# Patient Record
Sex: Male | Born: 1940 | Race: White | Hispanic: No | Smoking: Former smoker
Health system: Southern US, Community
[De-identification: ages and names within clinical notes are randomized; demographics above are authoritative.]

## PROBLEM LIST (undated history)

## (undated) DIAGNOSIS — I350 Nonrheumatic aortic (valve) stenosis: Secondary | ICD-10-CM

## (undated) DIAGNOSIS — M545 Low back pain, unspecified: Secondary | ICD-10-CM

## (undated) DIAGNOSIS — J449 Chronic obstructive pulmonary disease, unspecified: Secondary | ICD-10-CM

## (undated) DIAGNOSIS — I219 Acute myocardial infarction, unspecified: Secondary | ICD-10-CM

## (undated) DIAGNOSIS — I10 Essential (primary) hypertension: Secondary | ICD-10-CM

## (undated) DIAGNOSIS — I509 Heart failure, unspecified: Secondary | ICD-10-CM

## (undated) DIAGNOSIS — G912 (Idiopathic) normal pressure hydrocephalus: Secondary | ICD-10-CM

## (undated) DIAGNOSIS — E785 Hyperlipidemia, unspecified: Secondary | ICD-10-CM

## (undated) DIAGNOSIS — I639 Cerebral infarction, unspecified: Secondary | ICD-10-CM

## (undated) DIAGNOSIS — F0390 Unspecified dementia without behavioral disturbance: Secondary | ICD-10-CM

## (undated) DIAGNOSIS — K219 Gastro-esophageal reflux disease without esophagitis: Secondary | ICD-10-CM

## (undated) DIAGNOSIS — N200 Calculus of kidney: Secondary | ICD-10-CM

## (undated) DIAGNOSIS — D649 Anemia, unspecified: Secondary | ICD-10-CM

## (undated) DIAGNOSIS — I2510 Atherosclerotic heart disease of native coronary artery without angina pectoris: Secondary | ICD-10-CM

## (undated) HISTORY — DX: Atherosclerotic heart disease of native coronary artery without angina pectoris: I25.10

## (undated) HISTORY — DX: Chronic obstructive pulmonary disease, unspecified: J44.9

## (undated) HISTORY — PX: SPINE SURGERY: SHX786

## (undated) HISTORY — DX: Acute myocardial infarction, unspecified: I21.9

## (undated) HISTORY — DX: Nonrheumatic aortic (valve) stenosis: I35.0

## (undated) HISTORY — DX: Heart failure, unspecified: I50.9

## (undated) HISTORY — DX: Low back pain, unspecified: M54.50

## (undated) HISTORY — DX: Anemia, unspecified: D64.9

## (undated) HISTORY — DX: Unspecified dementia, unspecified severity, without behavioral disturbance, psychotic disturbance, mood disturbance, and anxiety: F03.90

## (undated) HISTORY — DX: Essential (primary) hypertension: I10

## (undated) HISTORY — PX: MICRODISCECTOMY LUMBAR: SUR864

## (undated) HISTORY — DX: (Idiopathic) normal pressure hydrocephalus: G91.2

## (undated) HISTORY — DX: Low back pain: M54.5

## (undated) HISTORY — DX: Hyperlipidemia, unspecified: E78.5

## (undated) HISTORY — DX: Calculus of kidney: N20.0

## (undated) HISTORY — DX: Gastro-esophageal reflux disease without esophagitis: K21.9

## (undated) HISTORY — DX: Cerebral infarction, unspecified: I63.9

## (undated) HISTORY — PX: CARDIAC CATHETERIZATION: SHX172

---

## 2001-07-01 DIAGNOSIS — I639 Cerebral infarction, unspecified: Secondary | ICD-10-CM

## 2001-07-01 HISTORY — DX: Cerebral infarction, unspecified: I63.9

## 2001-10-31 HISTORY — PX: CORONARY ARTERY BYPASS GRAFT: SHX141

## 2001-11-09 ENCOUNTER — Inpatient Hospital Stay (HOSPITAL_COMMUNITY)
Admission: AD | Admit: 2001-11-09 | Discharge: 2001-11-17 | Payer: Self-pay | Admitting: Thoracic Surgery (Cardiothoracic Vascular Surgery)

## 2001-11-10 ENCOUNTER — Encounter: Payer: Self-pay | Admitting: Thoracic Surgery (Cardiothoracic Vascular Surgery)

## 2001-11-12 ENCOUNTER — Encounter: Payer: Self-pay | Admitting: Thoracic Surgery (Cardiothoracic Vascular Surgery)

## 2001-11-13 ENCOUNTER — Encounter: Payer: Self-pay | Admitting: Thoracic Surgery (Cardiothoracic Vascular Surgery)

## 2001-11-14 ENCOUNTER — Encounter: Payer: Self-pay | Admitting: Thoracic Surgery (Cardiothoracic Vascular Surgery)

## 2002-10-15 ENCOUNTER — Encounter: Admission: RE | Admit: 2002-10-15 | Discharge: 2002-10-15 | Payer: Self-pay | Admitting: Neurosurgery

## 2002-10-15 ENCOUNTER — Encounter: Payer: Self-pay | Admitting: Neurosurgery

## 2002-10-29 ENCOUNTER — Encounter: Payer: Self-pay | Admitting: Neurosurgery

## 2002-10-29 ENCOUNTER — Encounter: Admission: RE | Admit: 2002-10-29 | Discharge: 2002-10-29 | Payer: Self-pay | Admitting: Neurosurgery

## 2002-11-12 ENCOUNTER — Encounter: Admission: RE | Admit: 2002-11-12 | Discharge: 2002-11-12 | Payer: Self-pay | Admitting: Neurosurgery

## 2002-11-12 ENCOUNTER — Encounter: Payer: Self-pay | Admitting: Neurosurgery

## 2003-01-09 ENCOUNTER — Encounter: Payer: Self-pay | Admitting: Neurosurgery

## 2003-01-09 ENCOUNTER — Encounter: Admission: RE | Admit: 2003-01-09 | Discharge: 2003-01-09 | Payer: Self-pay | Admitting: Neurosurgery

## 2003-07-01 ENCOUNTER — Encounter: Payer: Self-pay | Admitting: Neurosurgery

## 2003-07-02 ENCOUNTER — Encounter: Payer: Self-pay | Admitting: Neurosurgery

## 2003-07-02 ENCOUNTER — Ambulatory Visit (HOSPITAL_COMMUNITY): Admission: RE | Admit: 2003-07-02 | Discharge: 2003-07-02 | Payer: Self-pay | Admitting: Neurosurgery

## 2004-01-09 ENCOUNTER — Encounter: Admission: RE | Admit: 2004-01-09 | Discharge: 2004-01-09 | Payer: Self-pay | Admitting: Family Medicine

## 2004-01-30 ENCOUNTER — Encounter: Admission: RE | Admit: 2004-01-30 | Discharge: 2004-01-30 | Payer: Self-pay | Admitting: Family Medicine

## 2004-02-04 ENCOUNTER — Encounter: Admission: RE | Admit: 2004-02-04 | Discharge: 2004-02-04 | Payer: Self-pay | Admitting: Family Medicine

## 2004-03-03 ENCOUNTER — Encounter: Admission: RE | Admit: 2004-03-03 | Discharge: 2004-03-03 | Payer: Self-pay | Admitting: Family Medicine

## 2004-03-17 ENCOUNTER — Encounter: Admission: RE | Admit: 2004-03-17 | Discharge: 2004-03-17 | Payer: Self-pay | Admitting: Sports Medicine

## 2004-04-15 ENCOUNTER — Encounter: Admission: RE | Admit: 2004-04-15 | Discharge: 2004-04-15 | Payer: Self-pay | Admitting: Family Medicine

## 2004-06-18 ENCOUNTER — Encounter: Admission: RE | Admit: 2004-06-18 | Discharge: 2004-06-18 | Payer: Self-pay | Admitting: Family Medicine

## 2004-07-07 ENCOUNTER — Ambulatory Visit: Payer: Self-pay | Admitting: Family Medicine

## 2004-10-29 ENCOUNTER — Ambulatory Visit: Payer: Self-pay | Admitting: Family Medicine

## 2005-01-07 ENCOUNTER — Emergency Department (HOSPITAL_COMMUNITY): Admission: EM | Admit: 2005-01-07 | Discharge: 2005-01-07 | Payer: Self-pay | Admitting: Emergency Medicine

## 2005-04-01 ENCOUNTER — Ambulatory Visit: Payer: Self-pay | Admitting: Family Medicine

## 2005-05-13 ENCOUNTER — Ambulatory Visit: Payer: Self-pay | Admitting: Physician Assistant

## 2005-06-27 ENCOUNTER — Ambulatory Visit (HOSPITAL_COMMUNITY): Admission: RE | Admit: 2005-06-27 | Discharge: 2005-06-27 | Payer: Self-pay | Admitting: Family Medicine

## 2005-06-27 ENCOUNTER — Encounter: Admission: RE | Admit: 2005-06-27 | Discharge: 2005-06-27 | Payer: Self-pay | Admitting: Family Medicine

## 2005-06-27 ENCOUNTER — Ambulatory Visit: Payer: Self-pay | Admitting: Family Medicine

## 2005-07-01 ENCOUNTER — Ambulatory Visit: Payer: Self-pay | Admitting: Family Medicine

## 2005-07-13 ENCOUNTER — Encounter: Admission: RE | Admit: 2005-07-13 | Discharge: 2005-07-13 | Payer: Self-pay | Admitting: Sports Medicine

## 2005-07-14 ENCOUNTER — Ambulatory Visit: Payer: Self-pay | Admitting: Family Medicine

## 2005-07-28 ENCOUNTER — Ambulatory Visit (HOSPITAL_COMMUNITY): Admission: RE | Admit: 2005-07-28 | Discharge: 2005-07-28 | Payer: Self-pay | Admitting: Gastroenterology

## 2005-07-28 ENCOUNTER — Encounter (INDEPENDENT_AMBULATORY_CARE_PROVIDER_SITE_OTHER): Payer: Self-pay | Admitting: Specialist

## 2005-07-29 ENCOUNTER — Ambulatory Visit: Payer: Self-pay | Admitting: Family Medicine

## 2005-11-18 ENCOUNTER — Other Ambulatory Visit: Payer: Self-pay

## 2005-11-22 ENCOUNTER — Inpatient Hospital Stay: Payer: Self-pay | Admitting: Unknown Physician Specialty

## 2006-01-09 ENCOUNTER — Ambulatory Visit: Payer: Self-pay | Admitting: Family Medicine

## 2006-01-27 ENCOUNTER — Ambulatory Visit: Payer: Self-pay | Admitting: Family Medicine

## 2006-07-10 ENCOUNTER — Ambulatory Visit: Payer: Self-pay | Admitting: Family Medicine

## 2006-08-04 ENCOUNTER — Ambulatory Visit: Payer: Self-pay | Admitting: Family Medicine

## 2006-10-13 ENCOUNTER — Ambulatory Visit: Payer: Self-pay | Admitting: Sports Medicine

## 2006-10-26 ENCOUNTER — Ambulatory Visit: Payer: Self-pay | Admitting: Family Medicine

## 2006-12-28 DIAGNOSIS — K219 Gastro-esophageal reflux disease without esophagitis: Secondary | ICD-10-CM

## 2006-12-28 DIAGNOSIS — E119 Type 2 diabetes mellitus without complications: Secondary | ICD-10-CM

## 2006-12-28 DIAGNOSIS — D509 Iron deficiency anemia, unspecified: Secondary | ICD-10-CM | POA: Insufficient documentation

## 2006-12-28 DIAGNOSIS — F411 Generalized anxiety disorder: Secondary | ICD-10-CM | POA: Insufficient documentation

## 2006-12-28 DIAGNOSIS — E785 Hyperlipidemia, unspecified: Secondary | ICD-10-CM | POA: Insufficient documentation

## 2007-01-25 ENCOUNTER — Encounter: Payer: Self-pay | Admitting: Family Medicine

## 2007-01-25 ENCOUNTER — Ambulatory Visit: Payer: Self-pay | Admitting: Family Medicine

## 2007-01-25 DIAGNOSIS — I2510 Atherosclerotic heart disease of native coronary artery without angina pectoris: Secondary | ICD-10-CM | POA: Insufficient documentation

## 2007-01-25 DIAGNOSIS — Z8679 Personal history of other diseases of the circulatory system: Secondary | ICD-10-CM | POA: Insufficient documentation

## 2007-01-25 LAB — CONVERTED CEMR LAB
Calcium: 9.7 mg/dL (ref 8.4–10.5)
Direct LDL: 123 mg/dL — ABNORMAL HIGH
Hgb A1c MFr Bld: 7.9 %
Potassium: 4.4 meq/L (ref 3.5–5.3)
Sodium: 141 meq/L (ref 135–145)

## 2007-01-26 ENCOUNTER — Encounter (INDEPENDENT_AMBULATORY_CARE_PROVIDER_SITE_OTHER): Payer: Self-pay | Admitting: *Deleted

## 2007-02-15 ENCOUNTER — Encounter: Payer: Self-pay | Admitting: Family Medicine

## 2007-03-07 ENCOUNTER — Telehealth: Payer: Self-pay | Admitting: *Deleted

## 2007-05-07 ENCOUNTER — Telehealth (INDEPENDENT_AMBULATORY_CARE_PROVIDER_SITE_OTHER): Payer: Self-pay | Admitting: Family Medicine

## 2007-05-09 ENCOUNTER — Telehealth: Payer: Self-pay | Admitting: *Deleted

## 2007-05-10 ENCOUNTER — Encounter (INDEPENDENT_AMBULATORY_CARE_PROVIDER_SITE_OTHER): Payer: Self-pay | Admitting: Family Medicine

## 2007-05-10 ENCOUNTER — Ambulatory Visit: Payer: Self-pay | Admitting: Family Medicine

## 2007-05-17 ENCOUNTER — Telehealth (INDEPENDENT_AMBULATORY_CARE_PROVIDER_SITE_OTHER): Payer: Self-pay | Admitting: Family Medicine

## 2007-05-21 ENCOUNTER — Telehealth: Payer: Self-pay | Admitting: *Deleted

## 2007-05-23 ENCOUNTER — Encounter: Payer: Self-pay | Admitting: *Deleted

## 2007-06-07 ENCOUNTER — Ambulatory Visit: Payer: Self-pay | Admitting: Family Medicine

## 2007-06-07 ENCOUNTER — Encounter (INDEPENDENT_AMBULATORY_CARE_PROVIDER_SITE_OTHER): Payer: Self-pay | Admitting: Family Medicine

## 2007-06-07 LAB — CONVERTED CEMR LAB
Chloride: 108 meq/L (ref 96–112)
Hgb A1c MFr Bld: 7.3 %
Potassium: 4.6 meq/L (ref 3.5–5.3)
Sodium: 143 meq/L (ref 135–145)

## 2007-06-15 ENCOUNTER — Telehealth: Payer: Self-pay | Admitting: *Deleted

## 2007-07-06 ENCOUNTER — Telehealth: Payer: Self-pay | Admitting: *Deleted

## 2007-07-16 ENCOUNTER — Telehealth (INDEPENDENT_AMBULATORY_CARE_PROVIDER_SITE_OTHER): Payer: Self-pay | Admitting: Family Medicine

## 2007-07-24 ENCOUNTER — Telehealth (INDEPENDENT_AMBULATORY_CARE_PROVIDER_SITE_OTHER): Payer: Self-pay | Admitting: Family Medicine

## 2007-08-03 ENCOUNTER — Encounter: Payer: Self-pay | Admitting: *Deleted

## 2007-08-03 ENCOUNTER — Telehealth: Payer: Self-pay | Admitting: *Deleted

## 2007-10-05 ENCOUNTER — Telehealth (INDEPENDENT_AMBULATORY_CARE_PROVIDER_SITE_OTHER): Payer: Self-pay | Admitting: Family Medicine

## 2007-11-15 ENCOUNTER — Emergency Department (HOSPITAL_COMMUNITY): Admission: EM | Admit: 2007-11-15 | Discharge: 2007-11-15 | Payer: Self-pay | Admitting: Emergency Medicine

## 2007-11-16 ENCOUNTER — Telehealth (INDEPENDENT_AMBULATORY_CARE_PROVIDER_SITE_OTHER): Payer: Self-pay | Admitting: Family Medicine

## 2007-11-16 ENCOUNTER — Ambulatory Visit: Payer: Self-pay | Admitting: Family Medicine

## 2007-11-16 LAB — CONVERTED CEMR LAB: Hgb A1c MFr Bld: 7.6 %

## 2007-11-20 ENCOUNTER — Telehealth: Payer: Self-pay | Admitting: Family Medicine

## 2007-11-29 ENCOUNTER — Encounter (INDEPENDENT_AMBULATORY_CARE_PROVIDER_SITE_OTHER): Payer: Self-pay | Admitting: *Deleted

## 2007-11-30 ENCOUNTER — Telehealth (INDEPENDENT_AMBULATORY_CARE_PROVIDER_SITE_OTHER): Payer: Self-pay | Admitting: Family Medicine

## 2007-11-30 ENCOUNTER — Encounter (INDEPENDENT_AMBULATORY_CARE_PROVIDER_SITE_OTHER): Payer: Self-pay | Admitting: *Deleted

## 2007-12-11 ENCOUNTER — Telehealth (INDEPENDENT_AMBULATORY_CARE_PROVIDER_SITE_OTHER): Payer: Self-pay | Admitting: Family Medicine

## 2007-12-18 ENCOUNTER — Ambulatory Visit: Payer: Self-pay | Admitting: Family Medicine

## 2007-12-18 DIAGNOSIS — R32 Unspecified urinary incontinence: Secondary | ICD-10-CM

## 2008-02-11 ENCOUNTER — Encounter (INDEPENDENT_AMBULATORY_CARE_PROVIDER_SITE_OTHER): Payer: Self-pay | Admitting: Family Medicine

## 2008-04-18 ENCOUNTER — Encounter (INDEPENDENT_AMBULATORY_CARE_PROVIDER_SITE_OTHER): Payer: Self-pay | Admitting: Family Medicine

## 2008-05-07 ENCOUNTER — Ambulatory Visit: Payer: Self-pay | Admitting: Family Medicine

## 2008-05-07 LAB — CONVERTED CEMR LAB: Hgb A1c MFr Bld: 8.6 %

## 2008-07-14 ENCOUNTER — Telehealth: Payer: Self-pay | Admitting: *Deleted

## 2008-07-20 ENCOUNTER — Telehealth: Payer: Self-pay | Admitting: Family Medicine

## 2008-07-21 ENCOUNTER — Telehealth: Payer: Self-pay | Admitting: *Deleted

## 2008-07-21 ENCOUNTER — Telehealth (INDEPENDENT_AMBULATORY_CARE_PROVIDER_SITE_OTHER): Payer: Self-pay | Admitting: Family Medicine

## 2008-07-21 ENCOUNTER — Encounter (INDEPENDENT_AMBULATORY_CARE_PROVIDER_SITE_OTHER): Payer: Self-pay | Admitting: Family Medicine

## 2008-07-22 ENCOUNTER — Telehealth (INDEPENDENT_AMBULATORY_CARE_PROVIDER_SITE_OTHER): Payer: Self-pay | Admitting: *Deleted

## 2008-07-22 ENCOUNTER — Telehealth (INDEPENDENT_AMBULATORY_CARE_PROVIDER_SITE_OTHER): Payer: Self-pay | Admitting: Family Medicine

## 2008-07-30 ENCOUNTER — Telehealth: Payer: Self-pay | Admitting: *Deleted

## 2008-08-05 ENCOUNTER — Telehealth: Payer: Self-pay | Admitting: *Deleted

## 2008-08-06 ENCOUNTER — Telehealth (INDEPENDENT_AMBULATORY_CARE_PROVIDER_SITE_OTHER): Payer: Self-pay | Admitting: *Deleted

## 2008-08-07 ENCOUNTER — Telehealth: Payer: Self-pay | Admitting: *Deleted

## 2008-08-12 ENCOUNTER — Ambulatory Visit: Payer: Self-pay | Admitting: Family Medicine

## 2008-08-12 ENCOUNTER — Encounter (INDEPENDENT_AMBULATORY_CARE_PROVIDER_SITE_OTHER): Payer: Self-pay | Admitting: Family Medicine

## 2008-08-12 LAB — CONVERTED CEMR LAB: Hgb A1c MFr Bld: 7.7 %

## 2008-08-13 ENCOUNTER — Telehealth (INDEPENDENT_AMBULATORY_CARE_PROVIDER_SITE_OTHER): Payer: Self-pay | Admitting: *Deleted

## 2008-08-20 ENCOUNTER — Telehealth (INDEPENDENT_AMBULATORY_CARE_PROVIDER_SITE_OTHER): Payer: Self-pay | Admitting: *Deleted

## 2008-09-08 ENCOUNTER — Telehealth (INDEPENDENT_AMBULATORY_CARE_PROVIDER_SITE_OTHER): Payer: Self-pay | Admitting: Family Medicine

## 2008-09-09 ENCOUNTER — Encounter (INDEPENDENT_AMBULATORY_CARE_PROVIDER_SITE_OTHER): Payer: Self-pay | Admitting: *Deleted

## 2008-09-09 ENCOUNTER — Telehealth (INDEPENDENT_AMBULATORY_CARE_PROVIDER_SITE_OTHER): Payer: Self-pay | Admitting: Family Medicine

## 2008-09-12 ENCOUNTER — Telehealth (INDEPENDENT_AMBULATORY_CARE_PROVIDER_SITE_OTHER): Payer: Self-pay | Admitting: Family Medicine

## 2008-09-16 ENCOUNTER — Telehealth: Payer: Self-pay | Admitting: *Deleted

## 2008-09-17 ENCOUNTER — Ambulatory Visit: Payer: Self-pay | Admitting: Family Medicine

## 2008-09-17 ENCOUNTER — Encounter (INDEPENDENT_AMBULATORY_CARE_PROVIDER_SITE_OTHER): Payer: Self-pay | Admitting: Family Medicine

## 2008-09-18 ENCOUNTER — Encounter (INDEPENDENT_AMBULATORY_CARE_PROVIDER_SITE_OTHER): Payer: Self-pay | Admitting: Family Medicine

## 2008-09-18 ENCOUNTER — Telehealth (INDEPENDENT_AMBULATORY_CARE_PROVIDER_SITE_OTHER): Payer: Self-pay | Admitting: Family Medicine

## 2008-09-18 LAB — CONVERTED CEMR LAB
Cocaine Metabolites: NEGATIVE
Creatinine,U: 72.3 mg/dL
Ethyl Alcohol: <10 mg/dL (ref <10)
Opiates: NEGATIVE
Phencyclidine (PCP): NEGATIVE
Propoxyphene: NEGATIVE

## 2008-09-19 ENCOUNTER — Telehealth: Payer: Self-pay | Admitting: *Deleted

## 2008-09-22 ENCOUNTER — Encounter (INDEPENDENT_AMBULATORY_CARE_PROVIDER_SITE_OTHER): Payer: Self-pay | Admitting: Family Medicine

## 2008-09-23 ENCOUNTER — Ambulatory Visit: Payer: Self-pay | Admitting: Family Medicine

## 2008-09-24 ENCOUNTER — Telehealth (INDEPENDENT_AMBULATORY_CARE_PROVIDER_SITE_OTHER): Payer: Self-pay | Admitting: Family Medicine

## 2008-12-01 ENCOUNTER — Ambulatory Visit: Payer: Self-pay | Admitting: Family Medicine

## 2008-12-01 DIAGNOSIS — Z87442 Personal history of urinary calculi: Secondary | ICD-10-CM

## 2008-12-09 LAB — CONVERTED CEMR LAB
ALT: 14 units/L (ref 0–53)
AST: 19 units/L (ref 0–37)
Alkaline Phosphatase: 99 units/L (ref 39–117)
Basophils Absolute: 0.1 10*3/uL (ref 0.0–0.1)
Basophils Relative: 0.9 % (ref 0.0–3.0)
Bilirubin, Direct: 0.2 mg/dL (ref 0.0–0.3)
CO2: 31 meq/L (ref 19–32)
Calcium: 9.5 mg/dL (ref 8.4–10.5)
Chloride: 107 meq/L (ref 96–112)
Glucose, Bld: 215 mg/dL — ABNORMAL HIGH (ref 70–99)
Hemoglobin: 14.1 g/dL (ref 13.0–17.0)
Lymphocytes Relative: 31.3 % (ref 12.0–46.0)
MCHC: 34.6 g/dL (ref 30.0–36.0)
Monocytes Relative: 3.7 % (ref 3.0–12.0)
Neutro Abs: 4.3 10*3/uL (ref 1.4–7.7)
Neutrophils Relative %: 60.1 % (ref 43.0–77.0)
PSA: 0.19 ng/mL (ref 0.10–4.00)
RBC: 4.42 M/uL (ref 4.22–5.81)
RDW: 12.8 % (ref 11.5–14.6)
Sodium: 141 meq/L (ref 135–145)
TSH: 0.72 microintl units/mL (ref 0.35–5.50)
Total Bilirubin: 0.7 mg/dL (ref 0.3–1.2)
Total Protein: 6.7 g/dL (ref 6.0–8.3)

## 2009-01-21 ENCOUNTER — Encounter: Payer: Self-pay | Admitting: Family Medicine

## 2009-01-22 ENCOUNTER — Telehealth: Payer: Self-pay | Admitting: Family Medicine

## 2009-03-25 ENCOUNTER — Ambulatory Visit: Payer: Self-pay | Admitting: Family Medicine

## 2009-03-26 LAB — CONVERTED CEMR LAB
CO2: 31 meq/L (ref 19–32)
Chloride: 109 meq/L (ref 96–112)
Creatinine, Ser: 0.9 mg/dL (ref 0.4–1.5)
Hgb A1c MFr Bld: 7.6 % — ABNORMAL HIGH (ref 4.6–6.5)
Potassium: 4.7 meq/L (ref 3.5–5.1)
Sodium: 143 meq/L (ref 135–145)

## 2009-03-27 ENCOUNTER — Ambulatory Visit: Payer: Self-pay | Admitting: Cardiology

## 2009-04-07 ENCOUNTER — Encounter: Payer: Self-pay | Admitting: Cardiology

## 2009-04-07 ENCOUNTER — Ambulatory Visit: Payer: Self-pay

## 2009-04-13 ENCOUNTER — Encounter: Payer: Self-pay | Admitting: Cardiology

## 2009-04-13 ENCOUNTER — Ambulatory Visit: Payer: Self-pay | Admitting: Internal Medicine

## 2009-04-14 ENCOUNTER — Encounter: Payer: Self-pay | Admitting: Cardiology

## 2009-04-14 ENCOUNTER — Ambulatory Visit: Payer: Self-pay | Admitting: Cardiology

## 2009-04-14 LAB — CONVERTED CEMR LAB
ALT: 13 U/L
AST: 16 U/L
Albumin: 3.9 g/dL
Alkaline Phosphatase: 92 U/L
BUN: 12 mg/dL
CO2: 25 meq/L
Calcium: 9.3 mg/dL
Chloride: 111 meq/L
Cholesterol: 145 mg/dL
Creatinine, Ser: 0.95 mg/dL
Glucose, Bld: 164 mg/dL — ABNORMAL HIGH
HDL: 42 mg/dL
LDL Cholesterol: 83 mg/dL
Potassium: 3.9 meq/L
Sodium: 148 meq/L — ABNORMAL HIGH
Total Bilirubin: 0.4 mg/dL
Total CHOL/HDL Ratio: 3.5
Total Protein: 6.6 g/dL
Triglycerides: 101 mg/dL
VLDL: 20 mg/dL

## 2009-04-15 ENCOUNTER — Ambulatory Visit: Payer: Self-pay | Admitting: Family Medicine

## 2009-04-15 DIAGNOSIS — E1149 Type 2 diabetes mellitus with other diabetic neurological complication: Secondary | ICD-10-CM | POA: Insufficient documentation

## 2009-04-16 ENCOUNTER — Telehealth (INDEPENDENT_AMBULATORY_CARE_PROVIDER_SITE_OTHER): Payer: Self-pay | Admitting: *Deleted

## 2009-04-20 ENCOUNTER — Encounter: Payer: Self-pay | Admitting: Cardiology

## 2009-04-20 ENCOUNTER — Ambulatory Visit: Payer: Self-pay

## 2009-04-30 ENCOUNTER — Telehealth: Payer: Self-pay | Admitting: Family Medicine

## 2009-05-01 ENCOUNTER — Telehealth: Payer: Self-pay | Admitting: Family Medicine

## 2009-05-05 ENCOUNTER — Encounter: Payer: Self-pay | Admitting: Family Medicine

## 2009-05-07 ENCOUNTER — Ambulatory Visit: Payer: Self-pay | Admitting: Family Medicine

## 2009-05-07 LAB — CONVERTED CEMR LAB
HDL goal, serum: 40 mg/dL
LDL Goal: 70 mg/dL

## 2009-05-08 ENCOUNTER — Telehealth: Payer: Self-pay | Admitting: Family Medicine

## 2009-05-14 ENCOUNTER — Encounter: Payer: Self-pay | Admitting: Family Medicine

## 2009-05-14 ENCOUNTER — Telehealth: Payer: Self-pay | Admitting: Family Medicine

## 2009-05-15 ENCOUNTER — Ambulatory Visit: Payer: Self-pay | Admitting: Cardiovascular Disease

## 2009-05-15 ENCOUNTER — Inpatient Hospital Stay (HOSPITAL_COMMUNITY): Admission: EM | Admit: 2009-05-15 | Discharge: 2009-05-26 | Payer: Self-pay | Admitting: Emergency Medicine

## 2009-05-15 ENCOUNTER — Ambulatory Visit: Payer: Self-pay | Admitting: Internal Medicine

## 2009-05-15 ENCOUNTER — Encounter (INDEPENDENT_AMBULATORY_CARE_PROVIDER_SITE_OTHER): Payer: Self-pay | Admitting: Internal Medicine

## 2009-05-15 ENCOUNTER — Encounter: Payer: Self-pay | Admitting: Family Medicine

## 2009-05-15 ENCOUNTER — Telehealth: Payer: Self-pay | Admitting: Family Medicine

## 2009-05-16 ENCOUNTER — Encounter (INDEPENDENT_AMBULATORY_CARE_PROVIDER_SITE_OTHER): Payer: Self-pay | Admitting: Neurology

## 2009-05-18 ENCOUNTER — Encounter (INDEPENDENT_AMBULATORY_CARE_PROVIDER_SITE_OTHER): Payer: Self-pay | Admitting: Internal Medicine

## 2009-06-04 ENCOUNTER — Ambulatory Visit: Payer: Self-pay | Admitting: Family Medicine

## 2009-06-09 ENCOUNTER — Telehealth: Payer: Self-pay | Admitting: Family Medicine

## 2009-06-19 ENCOUNTER — Encounter: Payer: Self-pay | Admitting: Family Medicine

## 2009-06-29 ENCOUNTER — Telehealth: Payer: Self-pay | Admitting: Family Medicine

## 2009-07-01 ENCOUNTER — Telehealth: Payer: Self-pay | Admitting: Family Medicine

## 2009-07-07 ENCOUNTER — Encounter: Payer: Self-pay | Admitting: Family Medicine

## 2009-07-15 ENCOUNTER — Encounter: Payer: Self-pay | Admitting: Cardiology

## 2009-07-15 ENCOUNTER — Ambulatory Visit: Payer: Self-pay | Admitting: Cardiovascular Disease

## 2009-07-16 ENCOUNTER — Telehealth: Payer: Self-pay | Admitting: Family Medicine

## 2009-07-16 ENCOUNTER — Ambulatory Visit: Payer: Self-pay | Admitting: Cardiology

## 2009-07-16 DIAGNOSIS — I509 Heart failure, unspecified: Secondary | ICD-10-CM | POA: Insufficient documentation

## 2009-07-16 LAB — CONVERTED CEMR LAB
ALT: 12 units/L (ref 0–53)
AST: 12 units/L (ref 0–37)
Albumin: 4.1 g/dL (ref 3.5–5.2)
Alkaline Phosphatase: 117 units/L (ref 39–117)
Bilirubin, Direct: 0.1 mg/dL (ref 0.0–0.3)
Cholesterol: 115 mg/dL (ref 0–200)
HDL: 34 mg/dL — ABNORMAL LOW (ref >39)
Total Bilirubin: 0.6 mg/dL (ref 0.3–1.2)

## 2009-07-30 ENCOUNTER — Ambulatory Visit: Payer: Self-pay | Admitting: Cardiology

## 2009-08-03 LAB — CONVERTED CEMR LAB
CO2: 23 meq/L (ref 19–32)
Calcium: 9.1 mg/dL (ref 8.4–10.5)
Creatinine, Ser: 0.92 mg/dL (ref 0.40–1.50)
Glucose, Bld: 178 mg/dL — ABNORMAL HIGH (ref 70–99)

## 2009-08-19 ENCOUNTER — Ambulatory Visit: Payer: Self-pay | Admitting: Family Medicine

## 2009-09-16 ENCOUNTER — Encounter (INDEPENDENT_AMBULATORY_CARE_PROVIDER_SITE_OTHER): Payer: Self-pay | Admitting: *Deleted

## 2009-09-21 ENCOUNTER — Ambulatory Visit: Payer: Self-pay | Admitting: Family Medicine

## 2009-09-28 ENCOUNTER — Encounter: Payer: Self-pay | Admitting: Family Medicine

## 2009-09-30 ENCOUNTER — Encounter: Payer: Self-pay | Admitting: Family Medicine

## 2009-10-05 ENCOUNTER — Ambulatory Visit: Payer: Self-pay | Admitting: Family Medicine

## 2009-12-02 ENCOUNTER — Telehealth: Payer: Self-pay | Admitting: Family Medicine

## 2009-12-05 ENCOUNTER — Encounter: Payer: Self-pay | Admitting: Family Medicine

## 2009-12-05 ENCOUNTER — Emergency Department: Payer: Self-pay | Admitting: Emergency Medicine

## 2009-12-08 ENCOUNTER — Telehealth: Payer: Self-pay | Admitting: Family Medicine

## 2009-12-14 ENCOUNTER — Encounter: Payer: Self-pay | Admitting: Family Medicine

## 2009-12-14 ENCOUNTER — Ambulatory Visit: Payer: Self-pay | Admitting: Cardiovascular Disease

## 2009-12-14 DIAGNOSIS — I10 Essential (primary) hypertension: Secondary | ICD-10-CM

## 2009-12-17 ENCOUNTER — Ambulatory Visit: Payer: Self-pay | Admitting: Family Medicine

## 2009-12-18 ENCOUNTER — Encounter (INDEPENDENT_AMBULATORY_CARE_PROVIDER_SITE_OTHER): Payer: Self-pay | Admitting: *Deleted

## 2009-12-18 LAB — CONVERTED CEMR LAB
Albumin: 4 g/dL (ref 3.5–5.2)
Basophils Relative: 0.2 % (ref 0.0–3.0)
Bilirubin, Direct: 0.2 mg/dL (ref 0.0–0.3)
Calcium: 9.4 mg/dL (ref 8.4–10.5)
Creatinine, Ser: 0.8 mg/dL (ref 0.4–1.5)
Eosinophils Absolute: 0.2 10*3/uL (ref 0.0–0.7)
Glucose, Bld: 335 mg/dL — ABNORMAL HIGH (ref 70–99)
MCHC: 32.5 g/dL (ref 30.0–36.0)
MCV: 95.5 fL (ref 78.0–100.0)
Monocytes Absolute: 0.4 10*3/uL (ref 0.1–1.0)
Neutrophils Relative %: 64.1 % (ref 43.0–77.0)
Platelets: 130 10*3/uL — ABNORMAL LOW (ref 150.0–400.0)
RDW: 12.2 % (ref 11.5–14.6)
Total Bilirubin: 0.7 mg/dL (ref 0.3–1.2)
Total Protein: 6.7 g/dL (ref 6.0–8.3)

## 2010-01-04 ENCOUNTER — Ambulatory Visit: Payer: Self-pay | Admitting: Family Medicine

## 2010-01-05 ENCOUNTER — Telehealth: Payer: Self-pay | Admitting: Family Medicine

## 2010-01-07 ENCOUNTER — Telehealth: Payer: Self-pay | Admitting: Family Medicine

## 2010-01-15 ENCOUNTER — Telehealth: Payer: Self-pay | Admitting: Family Medicine

## 2010-02-03 ENCOUNTER — Ambulatory Visit: Payer: Self-pay | Admitting: Family Medicine

## 2010-02-04 ENCOUNTER — Encounter: Admission: RE | Admit: 2010-02-04 | Discharge: 2010-02-04 | Payer: Self-pay | Admitting: Family Medicine

## 2010-02-04 ENCOUNTER — Telehealth: Payer: Self-pay | Admitting: Family Medicine

## 2010-02-08 ENCOUNTER — Telehealth: Payer: Self-pay | Admitting: Family Medicine

## 2010-02-08 ENCOUNTER — Ambulatory Visit: Payer: Self-pay | Admitting: Family Medicine

## 2010-02-23 ENCOUNTER — Telehealth: Payer: Self-pay | Admitting: Family Medicine

## 2010-03-01 ENCOUNTER — Ambulatory Visit: Payer: Self-pay | Admitting: Family Medicine

## 2010-03-01 DIAGNOSIS — C4490 Unspecified malignant neoplasm of skin, unspecified: Secondary | ICD-10-CM

## 2010-03-02 LAB — CONVERTED CEMR LAB
GFR calc non Af Amer: 88.87 mL/min (ref >60)
Glucose, Bld: 154 mg/dL — ABNORMAL HIGH (ref 70–99)
Potassium: 3.8 meq/L (ref 3.5–5.1)
Sodium: 146 meq/L — ABNORMAL HIGH (ref 135–145)

## 2010-03-08 ENCOUNTER — Telehealth: Payer: Self-pay | Admitting: Family Medicine

## 2010-03-18 ENCOUNTER — Encounter: Payer: Self-pay | Admitting: Family Medicine

## 2010-03-19 ENCOUNTER — Emergency Department (HOSPITAL_COMMUNITY): Admission: EM | Admit: 2010-03-19 | Discharge: 2010-03-19 | Payer: Self-pay | Admitting: Family Medicine

## 2010-05-26 ENCOUNTER — Telehealth: Payer: Self-pay | Admitting: Family Medicine

## 2010-05-27 ENCOUNTER — Telehealth: Payer: Self-pay | Admitting: Family Medicine

## 2010-05-27 ENCOUNTER — Ambulatory Visit: Payer: Self-pay | Admitting: Family Medicine

## 2010-05-27 DIAGNOSIS — J4489 Other specified chronic obstructive pulmonary disease: Secondary | ICD-10-CM | POA: Insufficient documentation

## 2010-05-27 DIAGNOSIS — J449 Chronic obstructive pulmonary disease, unspecified: Secondary | ICD-10-CM

## 2010-05-29 LAB — CONVERTED CEMR LAB
ALT: 8 units/L (ref 0–53)
Albumin: 4.1 g/dL (ref 3.5–5.2)
BUN: 18 mg/dL (ref 6–23)
Basophils Relative: 0.2 % (ref 0.0–3.0)
Chloride: 101 meq/L (ref 96–112)
Eosinophils Relative: 0.8 % (ref 0.0–5.0)
HCT: 38.9 % — ABNORMAL LOW (ref 39.0–52.0)
Hemoglobin: 13.5 g/dL (ref 13.0–17.0)
Hgb A1c MFr Bld: 6.2 % (ref 4.6–6.5)
Lymphs Abs: 1.5 10*3/uL (ref 0.7–4.0)
MCV: 96.3 fL (ref 78.0–100.0)
Monocytes Absolute: 0.4 10*3/uL (ref 0.1–1.0)
Neutro Abs: 4.3 10*3/uL (ref 1.4–7.7)
Platelets: 126 10*3/uL — ABNORMAL LOW (ref 150.0–400.0)
Potassium: 4.4 meq/L (ref 3.5–5.1)
Pro B Natriuretic peptide (BNP): 259.3 pg/mL — ABNORMAL HIGH (ref 0.0–100.0)
RBC: 4.04 M/uL — ABNORMAL LOW (ref 4.22–5.81)
Total Protein: 6.8 g/dL (ref 6.0–8.3)
WBC: 6.2 10*3/uL (ref 4.5–10.5)

## 2010-06-08 ENCOUNTER — Emergency Department (HOSPITAL_COMMUNITY): Admission: EM | Admit: 2010-06-08 | Discharge: 2010-06-08 | Payer: Self-pay | Admitting: Emergency Medicine

## 2010-06-08 ENCOUNTER — Telehealth: Payer: Self-pay | Admitting: Cardiovascular Disease

## 2010-06-15 ENCOUNTER — Encounter: Payer: Self-pay | Admitting: Family Medicine

## 2010-06-15 ENCOUNTER — Ambulatory Visit: Payer: Self-pay

## 2010-06-16 ENCOUNTER — Ambulatory Visit: Payer: Self-pay | Admitting: Cardiovascular Disease

## 2010-07-06 ENCOUNTER — Telehealth: Payer: Self-pay | Admitting: Family Medicine

## 2010-07-06 ENCOUNTER — Inpatient Hospital Stay (HOSPITAL_COMMUNITY): Admission: EM | Admit: 2010-07-06 | Discharge: 2010-07-10 | Payer: Self-pay | Admitting: Emergency Medicine

## 2010-07-10 ENCOUNTER — Encounter (INDEPENDENT_AMBULATORY_CARE_PROVIDER_SITE_OTHER): Payer: Self-pay | Admitting: Internal Medicine

## 2010-07-10 ENCOUNTER — Ambulatory Visit: Payer: Self-pay | Admitting: Vascular Surgery

## 2010-07-29 ENCOUNTER — Observation Stay (HOSPITAL_COMMUNITY): Admission: EM | Admit: 2010-07-29 | Discharge: 2010-07-31 | Payer: Self-pay | Admitting: Emergency Medicine

## 2010-07-29 ENCOUNTER — Ambulatory Visit: Payer: Self-pay | Admitting: Internal Medicine

## 2010-07-30 ENCOUNTER — Encounter (INDEPENDENT_AMBULATORY_CARE_PROVIDER_SITE_OTHER): Payer: Self-pay | Admitting: Internal Medicine

## 2010-08-23 ENCOUNTER — Encounter (INDEPENDENT_AMBULATORY_CARE_PROVIDER_SITE_OTHER): Payer: Self-pay | Admitting: *Deleted

## 2010-08-23 ENCOUNTER — Ambulatory Visit: Payer: Self-pay | Admitting: Family Medicine

## 2010-08-23 DIAGNOSIS — L570 Actinic keratosis: Secondary | ICD-10-CM

## 2010-08-23 LAB — CONVERTED CEMR LAB
Basophils Absolute: 0 10*3/uL (ref 0.0–0.1)
Bilirubin Urine: NEGATIVE
Calcium: 9.3 mg/dL (ref 8.4–10.5)
Creatinine, Ser: 0.9 mg/dL (ref 0.4–1.5)
Eosinophils Absolute: 0.1 10*3/uL (ref 0.0–0.7)
GFR calc non Af Amer: 88.74 mL/min (ref >60)
Glucose, Bld: 139 mg/dL — ABNORMAL HIGH (ref 70–99)
Hemoglobin: 12.5 g/dL — ABNORMAL LOW (ref 13.0–17.0)
Hgb A1c MFr Bld: 6.6 % — ABNORMAL HIGH (ref 4.6–6.5)
Ketones, ur: NEGATIVE mg/dL
Lymphocytes Relative: 20.6 % (ref 12.0–46.0)
Monocytes Relative: 6.3 % (ref 3.0–12.0)
Neutro Abs: 4.8 10*3/uL (ref 1.4–7.7)
Neutrophils Relative %: 71.2 % (ref 43.0–77.0)
RDW: 14.6 % (ref 11.5–14.6)
Sodium: 143 meq/L (ref 135–145)
Specific Gravity, Urine: >=1.03 (ref 1.000–1.030)
Total Protein, Urine: NEGATIVE mg/dL
pH: 5 (ref 5.0–8.0)

## 2010-08-24 ENCOUNTER — Encounter: Payer: Self-pay | Admitting: Family Medicine

## 2010-08-27 ENCOUNTER — Encounter: Payer: Self-pay | Admitting: Family Medicine

## 2010-09-02 ENCOUNTER — Ambulatory Visit: Payer: Self-pay | Admitting: Family Medicine

## 2010-09-02 ENCOUNTER — Telehealth (INDEPENDENT_AMBULATORY_CARE_PROVIDER_SITE_OTHER): Payer: Self-pay | Admitting: *Deleted

## 2010-09-30 ENCOUNTER — Encounter: Payer: Self-pay | Admitting: Family Medicine

## 2010-10-05 ENCOUNTER — Encounter (INDEPENDENT_AMBULATORY_CARE_PROVIDER_SITE_OTHER): Payer: Self-pay | Admitting: *Deleted

## 2010-10-14 ENCOUNTER — Encounter: Payer: Self-pay | Admitting: Family Medicine

## 2010-11-12 ENCOUNTER — Telehealth: Payer: Self-pay | Admitting: Family Medicine

## 2010-11-24 ENCOUNTER — Encounter: Payer: Self-pay | Admitting: Family Medicine

## 2010-11-24 ENCOUNTER — Other Ambulatory Visit: Payer: Self-pay | Admitting: Family Medicine

## 2010-11-24 ENCOUNTER — Ambulatory Visit
Admission: RE | Admit: 2010-11-24 | Discharge: 2010-11-24 | Payer: Self-pay | Source: Home / Self Care | Attending: Family Medicine | Admitting: Family Medicine

## 2010-11-24 DIAGNOSIS — G20 Parkinson's disease: Secondary | ICD-10-CM | POA: Insufficient documentation

## 2010-11-24 DIAGNOSIS — G20A1 Parkinson's disease without dyskinesia, without mention of fluctuations: Secondary | ICD-10-CM | POA: Insufficient documentation

## 2010-11-24 LAB — CBC WITH DIFFERENTIAL/PLATELET
Basophils Absolute: 0 10*3/uL (ref 0.0–0.1)
Basophils Relative: 0.5 % (ref 0.0–3.0)
Eosinophils Absolute: 0.1 10*3/uL (ref 0.0–0.7)
Eosinophils Relative: 2 % (ref 0.0–5.0)
HCT: 33.5 % — ABNORMAL LOW (ref 39.0–52.0)
Hemoglobin: 11.4 g/dL — ABNORMAL LOW (ref 13.0–17.0)
Lymphocytes Relative: 26.9 % (ref 12.0–46.0)
Lymphs Abs: 1.4 10*3/uL (ref 0.7–4.0)
MCHC: 34 g/dL (ref 30.0–36.0)
MCV: 93.3 fl (ref 78.0–100.0)
Monocytes Absolute: 0.3 10*3/uL (ref 0.1–1.0)
Monocytes Relative: 6.5 % (ref 3.0–12.0)
Neutro Abs: 3.3 10*3/uL (ref 1.4–7.7)
Neutrophils Relative %: 64.1 % (ref 43.0–77.0)
Platelets: 92 10*3/uL — ABNORMAL LOW (ref 150.0–400.0)
RBC: 3.59 Mil/uL — ABNORMAL LOW (ref 4.22–5.81)
RDW: 16.4 % — ABNORMAL HIGH (ref 11.5–14.6)
WBC: 5.1 10*3/uL (ref 4.5–10.5)

## 2010-11-24 LAB — HEPATIC FUNCTION PANEL
ALT: 12 U/L (ref 0–53)
Bilirubin, Direct: 0.1 mg/dL (ref 0.0–0.3)
Total Bilirubin: 0.5 mg/dL (ref 0.3–1.2)

## 2010-11-24 LAB — BASIC METABOLIC PANEL
BUN: 14 mg/dL (ref 6–23)
CO2: 31 mEq/L (ref 19–32)
Calcium: 9.2 mg/dL (ref 8.4–10.5)
Chloride: 106 mEq/L (ref 96–112)
Creatinine, Ser: 0.9 mg/dL (ref 0.4–1.5)
GFR: 86.46 mL/min (ref >60.00)
Glucose, Bld: 209 mg/dL — ABNORMAL HIGH (ref 70–99)
Potassium: 3.6 mEq/L (ref 3.5–5.1)
Sodium: 143 mEq/L (ref 135–145)

## 2010-11-24 LAB — LDL CHOLESTEROL, DIRECT: Direct LDL: 41.5 mg/dL

## 2010-11-24 LAB — HEMOGLOBIN A1C: Hgb A1c MFr Bld: 7.2 % — ABNORMAL HIGH (ref 4.6–6.5)

## 2010-11-30 NOTE — Assessment & Plan Note (Signed)
Summary: 30 MIN F/ UP PER DR Shakari Qazi R/S 3/28   Vital Signs:  Patient profile:   70 year old male Height:      68 inches Weight:      192.2 pounds BMI:     29.33 Temp:     97.9 degrees F oral Pulse rate:   76 / minute Pulse rhythm:   regular BP sitting:   130 / 82  (left arm) Cuff size:   regular  Vitals Entered By: Benny Lennert CMA [facility] Dull) (February 03, 2010 11:44 AM)  History of Present Illness: Chief complaint follow up diabetes  DM: improving, 110's - 200's low post eating.   sugar free diet, no extra sugar  HTN: ok, tol all meds  occ swelling. compliant with meds recently. aid is helping a lot.  R foot - probable metatarsal fracture banged his foot into the door a few days ago now black and blue - distal 3-5 r foot.  Allergies: 1)  Lipitor (Atorvastatin Calcium)  Past History:  Past medical, surgical, family and social histories (including risk factors) reviewed, and no changes noted (except as noted below).  Past Medical History: Reviewed history from 09/21/2009 and no changes required. 1. Prior back surgery. 2. Type 2 diabetes with poor compliance 3. Hypertension. 4. Coronary artery disease status post MI in 2003 followed by coronary artery bypass grafting in January 2003.  The patient has a LIMA to the LAD, sequential saphenous vein graft to the second and third obtuse marginals, saphenous vein graft to the first diagonal, and a sequential saphenous vein graft to the PDA and an acute marginal.  Ad myoview 6/10 showed EF 39%, inferior/septal/inferolateral hypokinesis and scar, no ischemia. 5. CHF:   Echo (7/10) was technically difficult.  EF was depressed but hard to tell how severe.  Possible mild aortic stenosis.  Adenosine myoview (6/10) showed EF 39%.  6. History of falls. 7. Hyperlipidemia. 8. 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  9. Low back pain. 10.Hip pain. 11.GERD 12.Prior smoker. 13.History of  syncope. 14.Nephrolithiasis. 15. 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 INSOMNIA NOS (ICD-780.52) 20. ANEMIA, IRON DEFICIENCY, UNSPEC. (ICD-280.9) 21. Aortic stenosis: mild.   Past Surgical History: Reviewed history from 05/27/2009 and no changes required. 3v CABG - 10/31/2001 4 Spine surgeries, pt. unclear what occurred 1980s - 2000's cath - ef 35%, 3v dz - 10/31/2001 echo - ef 30%, mod RA/LA/LV enlargemt, mod MR/TR/AI - 05/01/2003 EYE IMPLANTS? - L4-5 extraforaminal microdiscectomy - 07/02/2003  MRI brain - chronic sm v ischemic changes & diffuse atrophy - 12/29/2001 MRI spine - lg R paracentral L4-5 disc protrusion, severe narrowing of neural foramen - 04/30/2002 R hip - mild degenerative changes - 02/28/2002 spinal facet blocks 12/03, 1/04, 3/04 - 12/30/2002 umb hernia repair - 07/31/2002 MRI brain, 04/2009, no infarct HOSP: 04/2009: found down, no CVA, ? normal pressure hydrocephalus  Family History: Reviewed history from 12/28/2006 and no changes required. 3 OTHER SISTERS ARE HEALTHY, DAD DIED OF EMPHYSEMA - AGE 78`S, MOM DIED OF HEART DZ, DM - AGE 61, SISTER DIED OF EMPHYSEMA - AGE 72  Social History: Reviewed history from 08/19/2009 and no changes required. SEPARATED FROM WIFE, 2 SONS, 2 DTR'S.  DISABLED PLUMBER SINCE 2002.   LIVES IN [place] W/ CATS, COWS, TURKEYS, GOATS, DONKEYS.   QUIT SMOKING 1990, FORMERLY 4-5PPD X 57YRS   NO ETOH. Ex-wife, [place] Citizen, in and out of the picture CANNOT READ AND WRITE  Review of Systems  See HPI General:  Denies chills, fatigue, and fever. CV:  Denies chest pain or discomfort. MS:  Complains of joint pain and joint swelling. Psych:  c/w some rare abnormal thought vs hallucination at night. f/u with Neuro next week.  Physical Exam  General:  NAD in wheelchair well-developed, well-nourished, well-hydrated, normal appearance, and cooperative to examination.   Head:  normocephalic, atraumatic, no abnormalities  observed, and no abnormalities palpated.   Ears:  no external deformities.   Nose:  no external deformity.   Lungs:  Normal respiratory effort, chest expands symmetrically. Lungs are clear to auscultation, no crackles or wheezes. Heart:  Normal rate and regular rhythm. III/VI SEM. S1 and S2 normal without gallop, click, rub or other extra sounds. Msk:  R forefoot edema bruising TTP 3-5 MT baseline loss of motion at ankle NT malleloli NT navicular, cuboid Psych:  Cognition and judgment appear intact. Alert and cooperative with normal attention span and concentration. No apparent delusions, illusions, hallucinations   Impression & Recommendations:  Problem # 1:  DIABETES MELLITUS II, UNCOMPLICATED (ICD-250.00) Assessment Improved  His updated medication list for this problem includes:    Benazepril Hcl 10 Mg Tabs (Benazepril hcl) .Marland Kitchen... 1 tab by mouth daily    Metformin Hcl 1000 Mg Tabs (Metformin hcl) .Marland Kitchen... 1 by mouth two times a day    Actos 45 Mg Tabs (Pioglitazone hcl) .Marland Kitchen... 1 by mouth daily    Onglyza 5 Mg Tabs (Saxagliptin hcl) .Marland Kitchen... 1 by mouth daily    Glipizide 5 Mg Xr24h-tab (Glipizide) .Marland Kitchen... 1 by mouth daily  Labs Reviewed: Creat: 0.8 (12/17/2009)   Microalbumin: trace (01/25/2007) Reviewed HgBA1c results: 8.2 (12/17/2009)  7.6 (03/25/2009)  Problem # 2:  FOOT PAIN, RIGHT (ICD-729.5) Assessment: New suspect MT fx has a CAM boot at home. XR [facility], call report to me.  wear CAM  Orders: Radiology Referral (Radiology)  Problem # 3:  SHOULDER PAIN, BILATERAL (ICD-719.41) Assessment: New B xr, ongoing shoulder pain somewhat limited given ? fx  Orders: Radiology Referral (Radiology)  Problem # 4:  ? 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 Take 1 tablet by mouth once a day 2)  Crestor 20 Mg Tabs  (Rosuvastatin calcium) .Marland Kitchen.. 1 by mouth at bedtime 3)  Oxybutynin Chloride 10 Mg Xr24h-tab (Oxybutynin chloride) .Marland Kitchen.. 1 by mouth daily 4)  Carvedilol 6.25 Mg Tabs (Carvedilol) .... Take one tablet by mouth twice a day 5)  Carbidopa-levodopa 25-100 Mg Tabs (Carbidopa-levodopa) .... 2 by mouth three times a day 6)  Plavix 75 Mg Tabs (Clopidogrel bisulfate) .... Take one a day 7)  Furosemide 40 Mg Tabs (Furosemide) .... Take one tablet by mouth daily. 8)  Benazepril Hcl 10 Mg Tabs (Benazepril hcl) .Marland Kitchen.. 1 tab by mouth daily 9)  Metformin Hcl 1000 Mg Tabs (Metformin hcl) .Marland Kitchen.. 1 by mouth two times a day 10)  Actos 45 Mg Tabs (Pioglitazone hcl) .Marland Kitchen.. 1 by mouth daily 11)  Lidoderm 5 % Ptch (Lidocaine) .... Apply for 12 hours to affected area, then keep off for 12 hours 12)  Onglyza 5 Mg Tabs (Saxagliptin hcl) .Marland Kitchen.. 1 by mouth daily 13)  Glipizide 5 Mg Xr24h-tab (Glipizide) .Marland Kitchen.. 1 by mouth daily  Patient Instructions: 1)  Referral Appointment Information 2)  Day/Date: 3)  Time: 4)  Place/MD: 5)  Address: 6)  Phone/Fax: 7)  Patient given appointment information.  Information/Orders faxed/mailed.  8)  INCREASE ACTOS DOSING 9)  GO GET XRAYS 10)  PUT THE BOOT ON.   Prescriptions: ACTOS 45 MG TABS (PIOGLITAZONE HCL) 1 by mouth daily  #30 x 11   Entered and Authorized by:   Hannah Beat MD   Signed by:   Hannah Beat MD on 02/03/2010   Method used:   Print then Give to Patient   RxID:   [id number]   Current Allergies (reviewed today): LIPITOR (ATORVASTATIN CALCIUM)

## 2010-11-30 NOTE — Miscellaneous (Signed)
Summary: Order for Tub Seat/[facility]  Order for Tub Seat/[facility]   Imported By: Lanelle Bal 12/19/2009 11:44:27  _____________________________________________________________________  External Attachment:    Type:   Image     Comment:   External Document

## 2010-11-30 NOTE — Progress Notes (Signed)
Summary: TAMSULOSIN  Phone Note Refill Request Message [phone] on July 06, 2010 4:50 PM  fax request for TAMSULOSIN HCL 0.4MG  take 1 by mouth once daily last refilled 05/27/2010, not on pt's med list. Please advise.   Method Requested: Electronic Initial call taken by: Mervin Hack CMA [facility]),  July 06, 2010 4:51 PM  Follow-up for Phone Call        i have never written for this.  call pharmacy - who wrote for it? does not appear in his EMR record.  Hannah Beat MD  July 06, 2010 5:10 PM   Additional Follow-up for Phone Call Additional follow up Details #1::        Spoke to pharmacist and was informed that this was called in under Dr. Cyndie Chime name on 05/27/10. See phone note done on 05/27/10. Sydell Axon LPN  July 07, 2010 11:25 AM      Additional Follow-up for Phone Call Additional follow up Details #2::    Call patient (speak to nurse or tammy if possible)  ask about this - if having less urinary problems, ok to refill -- but i gave it more acutely since he had not urinated that day 05/27/2010.  if they think it helped since starting, call in 1 year worth 1 by mouth daily Follow-up by: Hannah Beat MD,  July 07, 2010 11:47 AM  Additional Follow-up for Phone Call Additional follow up Details #3:: Details for Additional Follow-up Action Taken: Spoke with Lowella Bandy, (nurse) she stated that Mr. Crickenberger in at [facility] and has been since last night.  They think he has a bladder or kidney infection.  She says that on yesterday patient continued to have the urge to urinate but very little or no urine would come out.  Please advise.    Needs to be in hospital -- when he gets well, I want him to see outpatient urology  Patient notified as instructed by telephone. Patient stated that he will have his daughter call back to get it scheduled for him to see a urologist. Sydell Axon LPN  July 12, 2010 4:45 PM  Additional Follow-up by: Linde Gillis CMA [facility]ull),  July 07, 2010 12:00 PM  New/Updated Medications: TAMSULOSIN HCL 0.4 MG CAPS (TAMSULOSIN HCL) 1 by mouth daily Prescriptions: TAMSULOSIN HCL 0.4 MG CAPS (TAMSULOSIN HCL) 1 by mouth daily  #30 x 5   Entered and Authorized by:   Hannah Beat MD   Signed by:   Hannah Beat MD on 07/16/2010   Method used:   [facility]        [facility]  [place] (retail)       [address]       [place], [place]  [address]       Ph: [phone] or [phone]       Fax: [phone]   RxID:   [id number]    Please clarify...did pt go to hospital for eval of inability to urinate? Kerby Nora MD  July 13, 2010 5:52 PM   Patient did go to er and had a UTI and patient already urologist but, doesnt have an appt in the near future.Consuello Masse CMA    ******************************  I will clarify and handle. Patient hospitalized for urosepsis within the last 2 weeks. Long term bladder control issues, intermittently incontinent, NPH, usually wears a diaper. I will talk to them more about this the next time he is in the office.  He comes in a lot.    Call them: For now, I think it is reasonable enough to keep up the Flomax to see if it helps and just talk about with them face to face so there is no confusion. Hannah Beat MD  July 16, 2010 6:45 AM

## 2010-11-30 NOTE — Progress Notes (Signed)
Summary: X-ray call report  Phone Note From Other Clinic Call back at [phone]   Caller: [facility] Call For: Dr. Patsy Lager Summary of Call: Right shoulder: degenerative changes, probable roater cuff disease Left shoulder:  degenerative changes Right foot: fracture of the base of the right 5th proximal phalanx with intra articular extenion Patient is waiting there in the office to hear back from [place].  Please advise. Initial call taken by: Linde Gillis CMA [facility] Dull),  February 04, 2010 1:07 PM  Follow-up for Phone Call        films reviewed myself.  Cam walker, 4 weeks Follow-up by: Hannah Beat MD,  February 04, 2010 1:10 PM  Additional Follow-up for Phone Call Additional follow up Details #1::        Spoke with patient's daughter and advised her as instructed.  Cam walker boot for 4 weeks, f/u appt scheduled with Dr. Patsy Lager on 03/01/2010 at 12:00.  Dr. Patsy Lager said patient can have Tylenol or Motrin for pain, no narcotics needed for this type of fracture.   Additional Follow-up by: Linde Gillis CMA [facility]),  February 04, 2010 1:16 PM

## 2010-11-30 NOTE — Progress Notes (Signed)
Summary: blood sugar  Phone Note Call from Patient Call back at [phone]   Caller: Daughter Call For: [name] Beat MD Summary of Call: Daughter says that she is really concerned about her dad's blood sugar because she checked it about 2:50 and it was 413 and she does not understand why it is not coming down with all the new changes. She wants to know what she should do. Please advise.  Initial call taken by: Melody Comas,  January 07, 2010 3:00 PM  Follow-up for Phone Call        Spoke with patients daughter explained to her that medication takes a while to get in system and would not be making a difference after 1 day. Also advised patient make sure patient is taken medication routinely and not eating sweets because this would also make blood sugars run up. [facility] [place] Follow-up by: Benny Lennert CMA [facility] Dull),  January 07, 2010 3:23 PM    New/Updated Medications: GLIPIZIDE 5 MG XR24H-TAB (GLIPIZIDE) 1 by mouth daily Prescriptions: GLIPIZIDE 5 MG XR24H-TAB (GLIPIZIDE) 1 by mouth daily  #30 x 5   Entered and Authorized by:   [name] Beat MD   Signed by:   [name] Beat MD on 01/07/2010   Method used:   Telephoned to ...       [facility]  [place] ([facility]ail)       [address]       [place], [place]  [address]       Ph: [phone] or [phone]       Fax: [phone]   RxID:   [id number]

## 2010-11-30 NOTE — Progress Notes (Signed)
Summary: foot looks infected  Phone Note Call from Patient Call back at Home Phone [phone]   Caller: care giver Call For: Hannah Beat MD Summary of Call: Caregiver says that patients foot looks infected. She says that it started out just looking a little red, now she says it is red with white spots all over and feels very warm to the spot. Patient says that it hurts when you touch the spot. She wants to know if you can see him  today. Please advise. Initial call taken by: Melody Comas,  February 08, 2010 9:49 AM  Follow-up for Phone Call        yes Follow-up by: Hannah Beat MD,  February 08, 2010 10:09 AM  Additional Follow-up for Phone Call Additional follow up Details #1::        3:00 appt today Additional Follow-up by: Benny Lennert CMA [facility]),  February 08, 2010 10:11 AM

## 2010-11-30 NOTE — Miscellaneous (Signed)
Summary: Order/[facility]  Order/[facility]   Imported By: Sherian Rein 08/31/2010 08:30:35  _____________________________________________________________________  External Attachment:    Type:   Image     Comment:   External Document

## 2010-11-30 NOTE — Progress Notes (Signed)
Summary: cor no and will go  Phone Note Call from Patient Call back at [facility]   Caller: Patient Call For: Hannah Beat MD Summary of Call: Blood sugars today have been 376, 363, and right before they called it was 335. Wants to know if he can take his pill now instead of waiting until he eats his dinner. Please advise.Call daughter with instructions. [name]- [phone]. Initial call taken by: Melody Comas,  January 05, 2010 3:09 PM  Follow-up for Phone Call        He should  be on Metformin 1000 mg by mouth two times a day  Actos 30 mg daily Onglyza 5 mg a day  Please review how to take these medications  New changes will take some time to start lowering blood sugars, have him follow-up with me in 3 weeks, 30 minute appt Follow-up by: Hannah Beat MD,  January 05, 2010 3:13 PM  Additional Follow-up for Phone Call Additional follow up Details #1::        Spoke  with pt's daughter.  She said pt has taken his metformin and onglyza today and his aid has gone to pick up his actos so that he can start that.  Appt made for 3/28. Additional Follow-up by: Lowella Petties CMA,  January 05, 2010 3:28 PM    Additional Follow-up for Phone Call Additional follow up Details #2::    noted.  changes not done since last visit. expect may have to titrate up. does not follow diet well. Follow-up by: Hannah Beat MD,  January 05, 2010 3:35 PM

## 2010-11-30 NOTE — Miscellaneous (Signed)
  Clinical Lists Changes  Medications: Changed medication from CARBIDOPA-LEVODOPA 25-100 MG TABS (CARBIDOPA-LEVODOPA) 2 by mouth three times a day to CARBIDOPA-LEVODOPA 25-100 MG TABS (CARBIDOPA-LEVODOPA) 1 tablet in am 1 tablet midday and 1/2 tablet in the evening     Prior Medications: CRESTOR 20 MG TABS (ROSUVASTATIN CALCIUM) 1 by mouth at bedtime CARVEDILOL 6.25 MG TABS (CARVEDILOL) Take one tablet by mouth twice a day PLAVIX 75 MG TABS (CLOPIDOGREL BISULFATE) take one a day FUROSEMIDE 40 MG TABS (FUROSEMIDE) Take one tablet by mouth daily. BENAZEPRIL HCL 10 MG TABS (BENAZEPRIL HCL) 1 tab by mouth daily METFORMIN HCL 1000 MG TABS (METFORMIN HCL) 1 by mouth two times a day ACTOS 45 MG TABS (PIOGLITAZONE HCL) 1 by mouth daily LIDODERM 5 % PTCH (LIDOCAINE) Apply for 12 hours to affected area, then keep off for 12 hours ONGLYZA 5 MG TABS (SAXAGLIPTIN HCL) 1 by mouth daily GLIPIZIDE 5 MG XR24H-TAB (GLIPIZIDE) 1 by mouth daily UNDERPADS FOR INCONTINENCE () 788.30, dx. urinary incontinence SPIRIVA HANDIHALER 18 MCG  CAPS (TIOTROPIUM BROMIDE MONOHYDRATE) contents of one capsule inhaled daily PROAIR HFA 108 (90 BASE) MCG/ACT  AERS (ALBUTEROL SULFATE) 2 inh q4h as needed shortness of breath TAMSULOSIN HCL 0.4 MG CAPS (TAMSULOSIN HCL) 1 by mouth daily PROTONIX 40 MG TBEC (PANTOPRAZOLE SODIUM) take one tablet daily Current Allergies: LIPITOR (ATORVASTATIN CALCIUM)

## 2010-11-30 NOTE — Progress Notes (Signed)
Summary: FYI  Phone Note Call from Patient Call back at [facility] Phone [phone]   Caller: Daughter Call For: [facility] Summary of Call: [name] CALLED STATING THAT THE PT IS CONFUSED AND THAT SHE THINKS HE HAS HAD A STROKE-SHE IS TAKING HIM TO [place] AND HE WILL NOT BE ABLE TO MAKE HIS ECHO APPT TODAY Initial call taken by: Harlon Flor,  June 08, 2010 11:18 AM

## 2010-11-30 NOTE — Consult Note (Signed)
Summary: [facility]  [facility]   Imported By: Lanelle Bal 09/10/2010 14:49:44  _____________________________________________________________________  External Attachment:    Type:   Image     Comment:   External Document  Appended Document: [facility] cystoscopy negative

## 2010-11-30 NOTE — Miscellaneous (Signed)
Summary: Care Plans/[facility]  Care Plans/[facility]   Imported By: Sherian Rein 10/05/2010 11:28:35  _____________________________________________________________________  External Attachment:    Type:   Image     Comment:   External Document

## 2010-11-30 NOTE — Miscellaneous (Signed)
Summary: Care Plan/[facility]  Care Plan/[facility]   Imported By: Lanelle Bal 09/09/2010 09:51:55  _____________________________________________________________________  External Attachment:    Type:   Image     Comment:   External Document

## 2010-11-30 NOTE — Assessment & Plan Note (Signed)
Summary: FEET ARE SWOLLEN- 30 MINS PER DR [name]   Vital Signs:  Patient profile:   70 year old male Height:      68 inches Weight:      190.0 pounds BMI:     28.99 Temp:     99.0 degrees F oral Pulse rate:   72 / minute Pulse rhythm:   regular BP sitting:   108 / 70  (left arm) Cuff size:   regular  Vitals Entered By: Benny Lennert CMA Duncan Dull) (May 27, 2010 10:04 AM)  History of Present Illness: Chief complaint feet swelling for 1 week just keep getting worse  71 year old male:  multiple complaints. Worsening edema over the last week. Despite an increase in his Lasix dosing. They have been dosing his Lasix b.i.d. of 40 mg.  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  Patient is on ACE inhibitors and  beta blockers. as well as statin medication and an antiplatelet medication.  COPD. new  diagnoses. Dyspnea and cough Sputum production, in AM. color sputum routinely produced in the morning.  bblood sugars have been better controlled per her report, but he does often forget his medications.  Allergies: 1)  Lipitor (Atorvastatin Calcium)  Past History:  Past medical, surgical, family and social histories (including risk factors) reviewed, and no changes noted (except as noted below).  Past Medical History: medical noncompliance. 1. Prior back surgery. 2. Type 2 diabetes with poor compliance 3. Hypertension. 4. Coronary artery disease status post MI in 2003 followed by coronary artery bypass grafting in January 2003.  The patient has a LIMA to the LAD, sequential saphenous vein graft to the second and third obtuse marginals, saphenous vein graft to the first diagonal, and a sequential saphenous vein graft to the PDA and an acute marginal.  Ad myoview 6/10 showed EF 39%, inferior/septal/inferolateral hypokinesis and scar, no  ischemia. 5. CHF:   Echo (7/10) was technically difficult.  EF was depressed but hard to tell how severe.  Possible mild aortic stenosis.  Adenosine myoview (6/10) showed EF 39%.  6. History of falls. 7. Hyperlipidemia. 8. 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  9. Low back pain. 10.Hip pain. 11.GERD 12.Prior smoker. 13.History of syncope. 14.Nephrolithiasis. 15. 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 INSOMNIA NOS (ICD-780.52) 20. ANEMIA, IRON DEFICIENCY, UNSPEC. (ICD-280.9) 21. Aortic stenosis: mild.  COPD  Past Surgical History: Reviewed history from 05/27/2009 and no changes required. 3v CABG - 10/31/2001 4 Spine surgeries, pt. unclear what occurred 1980s - 2000's cath - ef 35%, 3v dz - 10/31/2001 echo - ef 30%, mod RA/LA/LV enlargemt, mod MR/TR/AI - 05/01/2003 EYE IMPLANTS? - L4-5 extraforaminal microdiscectomy - 07/02/2003  MRI brain - chronic sm v ischemic changes & diffuse atrophy - 12/29/2001 MRI spine - lg R paracentral L4-5 disc protrusion, severe narrowing of neural foramen - 04/30/2002 R hip - mild degenerative changes - 02/28/2002 spinal facet blocks 12/03, 1/04, 3/04 - 12/30/2002 umb hernia repair - 07/31/2002 MRI brain, 04/2009, no infarct HOSP: 04/2009: found down, no CVA, ? normal pressure hydrocephalus  Family History: Reviewed history from 12/28/2006 and no changes required. 3 OTHER SISTERS ARE HEALTHY, DAD DIED OF EMPHYSEMA - AGE 81`S, MOM DIED OF HEART DZ, DM - AGE 41, SISTER DIED OF EMPHYSEMA - AGE 74  Social History: Reviewed history from 08/19/2009 and no  changes required. SEPARATED FROM WIFE, 2 SONS, 2 DTR'S.  DISABLED PLUMBER SINCE 2002.   LIVES IN [place] W/ CATS, COWS, TURKEYS, GOATS, DONKEYS.   QUIT SMOKING 1990, FORMERLY 4-5PPD X 5YRS   NO ETOH. Ex-wife, [place] Citizen, in and out of the picture CANNOT READ AND WRITE  Review of Systems General:  Denies fever. CV:  Complains of shortness of breath with exertion  and swelling of feet; denies chest pain or discomfort. Resp:  Complains of cough and sputum productive; denies wheezing. MS:  the patient also bumped his toe earlier today.  Physical Exam  General:  alert, well-developed, and well-nourished.  Walks in with walker. Head:  Normocephalic and atraumatic without obvious abnormalities. No apparent alopecia or balding. Eyes:  vision grossly intact, pupils equal, pupils round, pupils reactive to light, pupils react to accomodation, corneas and lenses clear, and no injection.   Ears:  External ear exam shows no significant lesions or deformities.  Otoscopic examination reveals clear canals, tympanic membranes are intact bilaterally without bulging, retraction, inflammation or discharge. Hearing is grossly normal bilaterally. Nose:  External nasal examination shows no deformity or inflammation. Nasal mucosa are pink and moist without lesions or exudates. Mouth:  Oral mucosa and oropharynx without lesions or exudates.  Teeth in good repair. Lungs:  Normal respiratory effort, chest expands symmetrically. Lungs are clear to auscultation, no crackles or wheezes. Heart:  Normal rate and regular rhythm. III/VI SEM. S1 and S2 normal without gallop, click, rub or other extra sounds. Extremities:  2+ lower extremity edema. Neurologic:  alert & oriented X3.   Cervical Nodes:  No lymphadenopathy noted Psych:  Cognition and judgment appear intact. Alert and cooperative with normal attention span and concentration. No apparent delusions, illusions, hallucinations   Impression & Recommendations:  Problem # 1:  COPD (ICD-496) Assessment New  FEV1% = 59%  Moderate COPD based on Gold  Add Spiriva and as needed albuterol tthank you this is why his he is having coughing in the morning and producing sputum  His updated medication list for this problem includes:    Spiriva Handihaler 18 Mcg Caps (Tiotropium bromide monohydrate) .Marland Kitchen... Contents of one capsule  inhaled daily    Proair Hfa 108 (90 Base) Mcg/act Aers (Albuterol sulfate) .Marland Kitchen... 2 inh q4h as needed shortness of breath  Pulmonary Functions Reviewed: O2 sat: 93 (09/21/2009)     Vaccines Reviewed: Pneumovax: given (12/01/2008)     Problem # 2:  EDEMA- LOCALIZED (ICD-782.3)  cconcerned, could be worsening congestive heart failure, could be  worsening of renal or liver function. We'll check all of these, the patient is in to followup upcoming with cardiology next couple of weeks as well.  His updated medication list for this problem includes:    Furosemide 40 Mg Tabs (Furosemide) .Marland Kitchen... Take one tablet by mouth daily.  Orders: Prescription Created Electronically [phone]  Problem # 3:  CONGESTIVE HEART FAILURE, UNSPECIFIED (ICD-428.0) I like to see what his laboratories look like, but when I had upped his Lasix dose for now  His updated medication list for this problem includes:    Carvedilol 6.25 Mg Tabs (Carvedilol) .Marland Kitchen... Take one tablet by mouth twice a day    Plavix 75 Mg Tabs (Clopidogrel bisulfate) .Marland Kitchen... Take one a day    Furosemide 40 Mg Tabs (Furosemide) .Marland Kitchen... Take one tablet by mouth daily.    Benazepril Hcl 10 Mg Tabs (Benazepril hcl) .Marland Kitchen... 1 tab by mouth daily  Orders: Echo Referral (Echo)  Problem # 4:  HYPERTENSION,  BENIGN (ICD-401.1)  His updated medication list for this problem includes:    Carvedilol 6.25 Mg Tabs (Carvedilol) .Marland Kitchen... Take one tablet by mouth twice a day    Furosemide 40 Mg Tabs (Furosemide) .Marland Kitchen... Take one tablet by mouth daily.    Benazepril Hcl 10 Mg Tabs (Benazepril hcl) .Marland Kitchen... 1 tab by mouth daily  BP today: 108/70 Prior BP: 102/70 (03/01/2010)  Prior 10 Yr Risk Heart Disease: N/A (05/10/2007)  Labs Reviewed: K+: 3.8 (03/01/2010) Creat: : 0.9 (03/01/2010)   Chol: 115 (07/15/2009)   HDL: 34 (07/15/2009)   LDL: 60 (07/15/2009)   TG: 106 (07/15/2009)  Problem # 5:  DIABETES MELLITUS II, UNCOMPLICATED (ICD-250.00)  medication choices were  done and that the patient cannot read, has  a very long history of noncompliance,, and  there is a high level of concern that he will could not do insulin properly, he actually did go on insulin  for some time and his blood sugars were dramatically poor during that time.  +/- impact on CHF is noted and may have to be readdressed if decompensation occurs.  His updated medication list for this problem includes:    Benazepril Hcl 10 Mg Tabs (Benazepril hcl) .Marland Kitchen... 1 tab by mouth daily    Metformin Hcl 1000 Mg Tabs (Metformin hcl) .Marland Kitchen... 1 by mouth two times a day    Actos 45 Mg Tabs (Pioglitazone hcl) .Marland Kitchen... 1 by mouth daily    Onglyza 5 Mg Tabs (Saxagliptin hcl) .Marland Kitchen... 1 by mouth daily    Glipizide 5 Mg Xr24h-tab (Glipizide) .Marland Kitchen... 1 by mouth daily  Orders: TLB-A1C / Hgb A1C (Glycohemoglobin) (83036-A1C) TLB-Microalbumin/Creat Ratio, Urine (82043-MALB)  Problem # 6:  CORONARY ARTERY DISEASE (ICD-414.00)  His updated medication list for this problem includes:    Carvedilol 6.25 Mg Tabs (Carvedilol) .Marland Kitchen... Take one tablet by mouth twice a day    Plavix 75 Mg Tabs (Clopidogrel bisulfate) .Marland Kitchen... Take one a day    Furosemide 40 Mg Tabs (Furosemide) .Marland Kitchen... Take one tablet by mouth daily.    Benazepril Hcl 10 Mg Tabs (Benazepril hcl) .Marland Kitchen... 1 tab by mouth daily  Complete Medication List: 1)  Omeprazole 40 Mg Cpdr (Omeprazole) .... Take 1 tablet by mouth once a day 2)  Crestor 20 Mg Tabs (Rosuvastatin calcium) .Marland Kitchen.. 1 by mouth at bedtime 3)  Oxybutynin Chloride 10 Mg Xr24h-tab (Oxybutynin chloride) .Marland Kitchen.. 1 by mouth daily 4)  Carvedilol 6.25 Mg Tabs (Carvedilol) .... Take one tablet by mouth twice a day 5)  Carbidopa-levodopa 25-100 Mg Tabs (Carbidopa-levodopa) .... 2 by mouth three times a day 6)  Plavix 75 Mg Tabs (Clopidogrel bisulfate) .... Take one a day 7)  Furosemide 40 Mg Tabs (Furosemide) .... Take one tablet by mouth daily. 8)  Benazepril Hcl 10 Mg Tabs (Benazepril hcl) .Marland Kitchen.. 1 tab by mouth  daily 9)  Metformin Hcl 1000 Mg Tabs (Metformin hcl) .Marland Kitchen.. 1 by mouth two times a day 10)  Actos 45 Mg Tabs (Pioglitazone hcl) .Marland Kitchen.. 1 by mouth daily 11)  Lidoderm 5 % Ptch (Lidocaine) .... Apply for 12 hours to affected area, then keep off for 12 hours 12)  Onglyza 5 Mg Tabs (Saxagliptin hcl) .Marland Kitchen.. 1 by mouth daily 13)  Glipizide 5 Mg Xr24h-tab (Glipizide) .Marland Kitchen.. 1 by mouth daily 14)  Underpads For Incontinence  .Marland Kitchen.. 788.30, dx. urinary incontinence 15)  Spiriva Handihaler 18 Mcg Caps (Tiotropium bromide monohydrate) .... Contents of one capsule inhaled daily 16)  Proair Hfa 108 (90 Base) Mcg/act Aers (  Albuterol sulfate) .... 2 inh q4h as needed shortness of breath  Other Orders: Spirometry w/Graph (94010) Venipuncture (16109) TLB-BMP (Basic Metabolic Panel-BMET) (80048-METABOL) TLB-CBC Platelet - w/Differential (85025-CBCD) TLB-Hepatic/Liver Function Pnl (80076-HEPATIC) TLB-BNP (B-Natriuretic Peptide) (83880-BNPR)  Patient Instructions: 1)  Keep appt with Dr. Mariah Milling 2)  f/u with me in 3 months 3)  we will let you know about lab results Prescriptions: PROAIR HFA 108 (90 BASE) MCG/ACT  AERS (ALBUTEROL SULFATE) 2 inh q4h as needed shortness of breath  #1 x 1   Entered and Authorized by:   Hannah Beat MD   Signed by:   Hannah Beat MD on 05/27/2010   Method used:   Electronically to        [facility] [id number] (retail)       [address]       [place], [place]  [address]       Ph: [phone] or [phone]       Fax: [phone]   RxID:   [id number] SPIRIVA HANDIHALER 18 MCG  CAPS (TIOTROPIUM BROMIDE MONOHYDRATE) contents of one capsule inhaled daily  #1 x 11   Entered and Authorized by:   Hannah Beat MD   Signed by:   Hannah Beat MD on 05/27/2010   Method used:   [facility] to        [facility] [id number] (retail[facility]       [address]       [place], [place]  [address]       Ph: [phone] or [phone]       Fax: [phone]   RxID:    [id number]   Current Allergies (reviewed today): LIPITOR (ATORVASTATIN CALCIUM)   Appended Document: FEET ARE SWOLLEN- 30 MINS PER DR [name] Newman    Clinical Lists Changes  Orders: Added new Service order of Spirometry w/Graph (94010) - Signed

## 2010-11-30 NOTE — Assessment & Plan Note (Signed)
Summary: FOLLOW UP FR ER VISIT AT [facility]  CYD   Vital Signs:  Patient profile:   70 year old male Height:      68 inches Weight:      197.4 pounds BMI:     30.12 Temp:     97.9 degrees F oral Pulse rate:   76 / minute Pulse rhythm:   regular BP sitting:   130 / 80  (left arm) Cuff size:   regular  Vitals Entered By: Benny Lennert CMA Duncan Dull) (December 17, 2009 10:39 AM)  History of Present Illness: Chief complaint follow up er visit [facility]  70 year old male, f/u ER visit, bronchitis.  Feeling abou tthe same. Still with some nasal congestion and cough. the emergency roomPlace him on Avelox.  He is nontoxic and afebrile currently. Continues to have some coughing.  occasionally after going to bed: Seeing things at he will see some things  at night and gets somewhat confused. he cannot check his own blood sugar, cannot read, and his actual blood sugar  is unknown at that time.   Checking BS twice a day 130 before breakfast, and then in the afternoon. Will go up to 180-220. at the time of the office visit, hemoglobin A1c was unclear.  Currently he is taking metformin 1000 mg p.o. b.i.d., and additionally is taking Lantus 14 units at nighttime. There are some days when he does not take his Lantus, he is unable to give his own injections, and he cannot read.  Lantus 14 units at night.   Current Problems (verified): 1)  Confusion  (ICD-298.9) 2)  Hypertension, Benign  (ICD-401.1) 3)  Congestive Heart Failure, Unspecified  (ICD-428.0) 4)  Diabetic Peripheral Neuropathy  (ICD-250.60) 5)  Special Screening Malignant Neoplasm of Prostate  (ICD-V76.44) 6)  Encounter For Long-term Use of Other Medications  (ICD-V58.69) 7)  Nephrolithiasis, Hx of  (ICD-V13.01) 8)  Urinary Incontinence, Male  (ICD-788.30) 9)  ? of Normal Pressure Hydrocephalus  (ICD-331.3) 10)  Hyperlipidemia  (ICD-272.4) 11)  Gastroesophageal Reflux, No Esophagitis  (ICD-530.81) 12)  Diabetes Mellitus II,  Uncomplicated  (ICD-250.00) 13)  Anxiety  (ICD-300.00) 14)  Anemia, Iron Deficiency, Unspec.  (ICD-280.9) 15)  Cerebrovascular Accident, Hx of  (ICD-V12.50) 16)  Coronary Artery Disease  (ICD-414.00) 17)  Congestive Heart Failure  (ICD-428.0)  Allergies: 1)  Lipitor (Atorvastatin Calcium)  Past History:  Past medical, surgical, family and social histories (including risk factors) reviewed, and no changes noted (except as noted below).  Past Medical History: Reviewed history from 09/21/2009 and no changes required. 1. Prior back surgery. 2. Type 2 diabetes with poor compliance 3. Hypertension. 4. Coronary artery disease status post MI in 2003 followed by coronary artery bypass grafting in January 2003.  The patient has a LIMA to the LAD, sequential saphenous vein graft to the second and third obtuse marginals, saphenous vein graft to the first diagonal, and a sequential saphenous vein graft to the PDA and an acute marginal.  Ad myoview 6/10 showed EF 39%, inferior/septal/inferolateral hypokinesis and scar, no ischemia. 5. CHF:   Echo (7/10) was technically difficult.  EF was depressed but hard to tell how severe.  Possible mild aortic stenosis.  Adenosine myoview (6/10) showed EF 39%.  6. History of falls. 7. Hyperlipidemia. 8. 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  9. Low back pain. 10.Hip pain. 11.GERD 12.Prior smoker. 13.History of syncope. 14.Nephrolithiasis. 15. 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 INSOMNIA NOS (ICD-780.52) 20. ANEMIA, IRON DEFICIENCY, UNSPEC. (  ICD-280.9) 21. Aortic stenosis: mild.   Past Surgical History: Reviewed history from 05/27/2009 and no changes required. 3v CABG - 10/31/2001 4 Spine surgeries, pt. unclear what occurred 1980s - 2000's cath - ef 35%, 3v dz - 10/31/2001 echo - ef 30%, mod RA/LA/LV enlargemt, mod MR/TR/AI - 05/01/2003 EYE IMPLANTS? - L4-5 extraforaminal microdiscectomy - 07/02/2003  MRI  brain - chronic sm v ischemic changes & diffuse atrophy - 12/29/2001 MRI spine - lg R paracentral L4-5 disc protrusion, severe narrowing of neural foramen - 04/30/2002 R hip - mild degenerative changes - 02/28/2002 spinal facet blocks 12/03, 1/04, 3/04 - 12/30/2002 umb hernia repair - 07/31/2002 MRI brain, 04/2009, no infarct HOSP: 04/2009: found down, no CVA, ? normal pressure hydrocephalus  Family History: Reviewed history from 12/28/2006 and no changes required. 3 OTHER SISTERS ARE HEALTHY, DAD DIED OF EMPHYSEMA - AGE 59`S, MOM DIED OF HEART DZ, DM - AGE 73, SISTER DIED OF EMPHYSEMA - AGE 8  Social History: Reviewed history from 08/19/2009 and no changes required. SEPARATED FROM WIFE, 2 SONS, 2 DTR'S.  DISABLED PLUMBER SINCE 2002.   LIVES IN [place] W/ CATS, COWS, TURKEYS, GOATS, DONKEYS.   QUIT SMOKING 1990, FORMERLY 4-5PPD X 64YRS   NO ETOH. Ex-wife, [place] Citizen, in and out of the picture CANNOT READ AND WRITE  Review of Systems      See HPI General:  Complains of chills and fatigue. CV:  Denies chest pain or discomfort and swelling of feet. Resp:  Complains of cough and sputum productive. GI:  Denies nausea and vomiting.  Physical Exam  General:  NAD in wheelchair well-developed, well-nourished, well-hydrated, normal appearance, and cooperative to examination.   Head:  normocephalic, atraumatic, no abnormalities observed, and no abnormalities palpated.   Eyes:  vision grossly intact.   Ears:  no external deformities.   Nose:  External nasal examination shows no deformity or inflammation. Nasal mucosa are pink and moist without lesions or exudates. Mouth:  pharynx pink and moist, no erythema, no exudates, and no posterior lymphoid hypertrophy.   Neck:  No deformities, masses, or tenderness noted. Lungs:  Normal respiratory effort, chest expands symmetrically. Lungs are clear to auscultation, no crackles or wheezes. Heart:  Normal rate and regular rhythm. III/VI SEM. S1 and S2 normal  without gallop, click, rub or other extra sounds. Extremities:  no edema Neurologic:  alert & oriented X3.   Cervical Nodes:  No lymphadenopathy noted Psych:  Cognition and judgment appear intact. Alert and cooperative with normal attention span and concentration. No apparent delusions, illusions, hallucinations   Impression & Recommendations:  Problem # 1:  CONFUSION (ICD-298.9) Assessment New final sure what is driving his intermittent nighttime confusion. Certainly the patient is to have a history of stroke, additionally has some  baseline dementia.  Light weights and basic laboratories. Also had a questionable history for probable history of normal pressure hydrocephalus  while in the hospital in July.  Certainly, he could be also having some very low blood sugars, and is unable to check this himself.  I'm going to simple 5 his diabetes medication regimen and recheck him in 3 or 4 weeks.  Orders: Venipuncture (16109) TLB-Renal Function Panel (80069-RENAL) TLB-CBC Platelet - w/Differential (85025-CBCD) TLB-Hepatic/Liver Function Pnl (80076-HEPATIC) TLB-TSH (Thyroid Stimulating Hormone) (84443-TSH)  Problem # 2:  DIABETES MELLITUS II, UNCOMPLICATED (ICD-250.00) Assessment: Deteriorated I'm concerned, not necessarily confident that he is able  in his family is able to  dose his insulin.  He is missing doses. Ideally, we can  simplify his regimen, so that he can  be  given his medications when his home health aide is in  his home. Simplified as much as possible.  Ideal blood sugar control  may not be  possible.  The following medications were removed from the medication list:    Lantus 100 Unit/ml Soln (Insulin glargine) .Marland Kitchen... 14 units [place] at bedtime, disp qs His updated medication list for this problem includes:    Benazepril Hcl 10 Mg Tabs (Benazepril hcl) .Marland Kitchen... 1 tab by mouth daily    Metformin Hcl 1000 Mg Tabs (Metformin hcl) .Marland Kitchen... 1 by mouth two times a day    Onglyza 5 Mg Tabs  (Saxagliptin hcl) .Marland Kitchen... 1 by mouth daily  Problem # 3:  CONGESTIVE HEART FAILURE, UNSPECIFIED (ICD-428.0)  His updated medication list for this problem includes:    Carvedilol 6.25 Mg Tabs (Carvedilol) .Marland Kitchen... Take one tablet by mouth twice a day    Plavix 75 Mg Tabs (Clopidogrel bisulfate) .Marland Kitchen... Take one a day    Furosemide 20 Mg Tabs (Furosemide) .Marland Kitchen... 1 tablet daily    Benazepril Hcl 10 Mg Tabs (Benazepril hcl) .Marland Kitchen... 1 tab by mouth daily  Problem # 4:  CEREBROVASCULAR ACCIDENT, HX OF (ICD-V12.50) good blood pressure control, on Plavix, secondary risk factors are being minimized.  Complete Medication List: 1)  Omeprazole 20 Mg Cpdr (Omeprazole) .... Take 1 capsule by mouth once a day 2)  Crestor 20 Mg Tabs (Rosuvastatin calcium) .Marland Kitchen.. 1 by mouth at bedtime 3)  Detrol La 4 Mg Xr24h-cap (Tolterodine tartrate) .Marland Kitchen.. 1 by mouth daily 4)  Carvedilol 6.25 Mg Tabs (Carvedilol) .... Take one tablet by mouth twice a day 5)  Carbidopa-levodopa 25-100 Mg Tabs (Carbidopa-levodopa) .Marland Kitchen.. 1 by mouth three times a day 6)  Plavix 75 Mg Tabs (Clopidogrel bisulfate) .... Take one a day 7)  Furosemide 20 Mg Tabs (Furosemide) .Marland Kitchen.. 1 tablet daily 8)  Benazepril Hcl 10 Mg Tabs (Benazepril hcl) .Marland Kitchen.. 1 tab by mouth daily 9)  Metformin Hcl 1000 Mg Tabs (Metformin hcl) .Marland Kitchen.. 1 by mouth two times a day 10)  Power Mobility Device  .... Date of face to face exam: 09/21/2009 length of need: 99 (lifetime) weight=201 pounds  icd-9 codes: 428.0, 250.60, 719.45, 331.3, v12.50, 414.0 11)  Avelox 400 Mg Tabs (Moxifloxacin hcl) .Marland Kitchen.. 1 by mouth once daily 12)  Combivent 18-103 Mcg/act Aero (Ipratropium-albuterol) .... As directed 13)  Onglyza 5 Mg Tabs (Saxagliptin hcl) .Marland Kitchen.. 1 by mouth daily  Other Orders: TLB-A1C / Hgb A1C (Glycohemoglobin) (83036-A1C)  Patient Instructions: 1)  STOP LANTUS 2)  START ONGLYZA 3)  recheck in 3 weeks, 30 minute appointment 4)  BRING IN BLOOD SUGAR RECORD Prescriptions: ONGLYZA 5 MG TABS  (SAXAGLIPTIN HCL) 1 by mouth daily  #30 x 11   Entered and Authorized by:   Hannah Beat MD   Signed by:   Hannah Beat MD on 12/17/2009   Method used:   Print then Give to Patient   RxID:   [id number]   Current Allergies (reviewed today): LIPITOR (ATORVASTATIN CALCIUM)

## 2010-11-30 NOTE — Letter (Signed)
Summary: [facility]  [facility]   Imported By: Lanelle Bal 03/31/2010 10:26:04  _____________________________________________________________________  External Attachment:    Type:   Image     Comment:   External Document

## 2010-11-30 NOTE — Progress Notes (Signed)
Summary: needs order for underpads  Phone Note From Other Clinic   Caller: Darl Pikes with [facility]  [phone] Summary of Call: [facility] nurse needs order for underpads, number [phone], faxed to her at [phone].  Please include diagnosis of incontinence. Initial call taken by: Lowella Petties CMA,  February 23, 2010 9:04 AM  Follow-up for Phone Call        in outbox. Follow-up by: Kerby Nora MD,  February 23, 2010 12:59 PM  Additional Follow-up for Phone Call Additional follow up Details #1::        done.Consuello Masse CMA  Additional Follow-up by: Benny Lennert CMA Duncan Dull),  February 23, 2010 3:23 PM

## 2010-11-30 NOTE — Progress Notes (Signed)
Summary: med change  Phone Note Other Incoming Call back at Darl Pikes [phone]   Caller: Darl Pikes from [facility] Summary of Call: Darl Pikes says that she went out to visit patient today and wanted to let you know that he is no longer taking Gabepentin and He is taking 40mg  of  Furosemide instead of 20mg . He is scheduled to follow up with you tomorrow.  Initial call taken by: Melody Comas,  December 02, 2009 3:13 PM

## 2010-11-30 NOTE — Assessment & Plan Note (Signed)
Summary: 3:00 foot infected/hmw   Vital Signs:  Patient profile:   70 year old male Height:      68 inches Weight:      192.8 pounds BMI:     29.42 Temp:     98.1 degrees F oral Pulse rate:   76 / minute Pulse rhythm:   regular BP sitting:   120 / 78  (left arm) Cuff size:   regular  Vitals Entered By: Benny Lennert CMA Duncan Dull) (February 08, 2010 2:52 PM)  History of Present Illness: Chief complaint ? infection in foot  Last week, patient broke his R 5th toe and also abraded the skin. Now he has redness and warmth on his dorsal of his foot. This has spread from yest to today per report. Painful to touch   REVIEW OF SYSTEMS GEN: Acute illness details above. No fever, chills, sweats. CV: No chest pain or SOB GI: No noted N or V Otherwise, pertinent positives and negatives are noted in the HPI.     Allergies: 1)  Lipitor (Atorvastatin Calcium)  Past History:  Past medical, surgical, family and social histories (including risk factors) reviewed, and no changes noted (except as noted below).  Past Medical History: Reviewed history from 09/21/2009 and no changes required. 1. Prior back surgery. 2. Type 2 diabetes with poor compliance 3. Hypertension. 4. Coronary artery disease status post MI in 2003 followed by coronary artery bypass grafting in January 2003.  The patient has a LIMA to the LAD, sequential saphenous vein graft to the second and third obtuse marginals, saphenous vein graft to the first diagonal, and a sequential saphenous vein graft to the PDA and an acute marginal.  Ad myoview 6/10 showed EF 39%, inferior/septal/inferolateral hypokinesis and scar, no ischemia. 5. CHF:   Echo (7/10) was technically difficult.  EF was depressed but hard to tell how severe.  Possible mild aortic stenosis.  Adenosine myoview (6/10) showed EF 39%.  6. History of falls. 7. Hyperlipidemia. 8. 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  9. Low back pain. 10.Hip  pain. 11.GERD 12.Prior smoker. 13.History of syncope. 14.Nephrolithiasis. 15. 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 INSOMNIA NOS (ICD-780.52) 20. ANEMIA, IRON DEFICIENCY, UNSPEC. (ICD-280.9) 21. Aortic stenosis: mild.   Past Surgical History: Reviewed history from 05/27/2009 and no changes required. 3v CABG - 10/31/2001 4 Spine surgeries, pt. unclear what occurred 1980s - 2000's cath - ef 35%, 3v dz - 10/31/2001 echo - ef 30%, mod RA/LA/LV enlargemt, mod MR/TR/AI - 05/01/2003 EYE IMPLANTS? - L4-5 extraforaminal microdiscectomy - 07/02/2003  MRI brain - chronic sm v ischemic changes & diffuse atrophy - 12/29/2001 MRI spine - lg R paracentral L4-5 disc protrusion, severe narrowing of neural foramen - 04/30/2002 R hip - mild degenerative changes - 02/28/2002 spinal facet blocks 12/03, 1/04, 3/04 - 12/30/2002 umb hernia repair - 07/31/2002 MRI brain, 04/2009, no infarct HOSP: 04/2009: found down, no CVA, ? normal pressure hydrocephalus  Family History: Reviewed history from 12/28/2006 and no changes required. 3 OTHER SISTERS ARE HEALTHY, DAD DIED OF EMPHYSEMA - AGE 85`S, MOM DIED OF HEART DZ, DM - AGE 69, SISTER DIED OF EMPHYSEMA - AGE 39  Social History: Reviewed history from 08/19/2009 and no changes required. SEPARATED FROM WIFE, 2 SONS, 2 DTR'S.  DISABLED PLUMBER SINCE 2002.   LIVES IN [place] W/ CATS, COWS, TURKEYS, GOATS, DONKEYS.   QUIT SMOKING 1990, FORMERLY 4-5PPD X 20YRS   NO ETOH. Ex-wife, [place] Citizen, in and out of the picture CANNOT  READ AND WRITE  Physical Exam  General:  NAD in wheelchair well-developed, well-nourished, well-hydrated, normal appearance, and cooperative to examination.   Head:  normocephalic, atraumatic, no abnormalities observed, and no abnormalities palpated.   Ears:  no external deformities.   Nose:  no external deformity.   Msk:  NT b malleoli T at 4th and 5th MT and toes Extremities:  no edema Skin:  R foot, dorsum with redness,  warmth throughout most of dorsum. Psych:  Cognition and judgment appear intact. Alert and cooperative with normal attention span and concentration. No apparent delusions, illusions, hallucinations   Impression & Recommendations:  Problem # 1:  CELLULITIS, FOOT (ICD-682.7) Assessment New f/u thursday with me  His updated medication list for this problem includes:    Cephalexin 500 Mg Caps (Cephalexin) .Marland Kitchen... 1 by mouth 4 times daily    Levaquin 500 Mg Tabs (Levofloxacin) .Marland Kitchen... 1 by mouth daily  Orders: Prescription Created Electronically [phone])  Elevate affected area. Warm moist compresses for 20 minutes every 2 hours while awake.   Problem # 2:  FRACTURE, TOE, RIGHT (ICD-826.0) Xrays reviewed stable, proximal fx  5th  c/w his orthosis  DOI approx 01/29/2010  Complete Medication List: 1)  Omeprazole 40 Mg Cpdr (Omeprazole) .... Take 1 tablet by mouth once a day 2)  Crestor 20 Mg Tabs (Rosuvastatin calcium) .Marland Kitchen.. 1 by mouth at bedtime 3)  Oxybutynin Chloride 10 Mg Xr24h-tab (Oxybutynin chloride) .Marland Kitchen.. 1 by mouth daily 4)  Carvedilol 6.25 Mg Tabs (Carvedilol) .... Take one tablet by mouth twice a day 5)  Carbidopa-levodopa 25-100 Mg Tabs (Carbidopa-levodopa) .... 2 by mouth three times a day 6)  Plavix 75 Mg Tabs (Clopidogrel bisulfate) .... Take one a day 7)  Furosemide 40 Mg Tabs (Furosemide) .... Take one tablet by mouth daily. 8)  Benazepril Hcl 10 Mg Tabs (Benazepril hcl) .Marland Kitchen.. 1 tab by mouth daily 9)  Metformin Hcl 1000 Mg Tabs (Metformin hcl) .Marland Kitchen.. 1 by mouth two times a day 10)  Actos 45 Mg Tabs (Pioglitazone hcl) .Marland Kitchen.. 1 by mouth daily 11)  Lidoderm 5 % Ptch (Lidocaine) .... Apply for 12 hours to affected area, then keep off for 12 hours 12)  Onglyza 5 Mg Tabs (Saxagliptin hcl) .Marland Kitchen.. 1 by mouth daily 13)  Glipizide 5 Mg Xr24h-tab (Glipizide) .Marland Kitchen.. 1 by mouth daily 14)  Cephalexin 500 Mg Caps (Cephalexin) .Marland Kitchen.. 1 by mouth 4 times daily 15)  Levaquin 500 Mg Tabs (Levofloxacin)  .Marland Kitchen.. 1 by mouth daily  Patient Instructions: 1)  f/u thursday with Dr. Patsy Lager Prescriptions: LEVAQUIN 500 MG TABS (LEVOFLOXACIN) 1 by mouth daily  #10 x 0   Entered and Authorized by:   Hannah Beat MD   Signed by:   Hannah Beat MD on 02/08/2010   Method used:   [facility]        [id number] (ret[facility]       [address]       [place], [place]  [address]       Ph: [phone] or [phone]       Fax: [phone]   RxID:   [id number] CEPHALEXIN 500 MG CAPS (CEPHALEXIN) 1 by mouth 4 times daily  #40 x 0   Entered and Authorized by:   Hannah Beat MD   Signed by:   Hannah Beat MD on 02/08/2010   Method used:   [facility]ron[facility]        [phone] (retail)  [address]       [place], [place]  [address]       Ph: [phone] or [phone]       Fax: [phone]   RxID:   [id number]   Current Allergies (reviewed today): LIPITOR (ATORVASTATIN CALCIUM)

## 2010-11-30 NOTE — Letter (Signed)
Summary: Letter Regarding [facility] [employer]/[employer]   Letter Regarding [facility]/[employer] [facility]   Imported By: Lanelle Bal 12/25/2009 09:22:56  _____________________________________________________________________  External Attachment:    Type:   Image     Comment:   External Document

## 2010-11-30 NOTE — Progress Notes (Signed)
Summary: crestor, glipizide  Phone Note Refill Request Message from:  Patient on July 06, 2010 11:20 AM  Refills Requested: Medication #1:  GLIPIZIDE 5 MG XR24H-TAB 1 by mouth daily  Medication #2:  CRESTOR 20 MG TABS 1 by mouth at bedtime [facility]  Initial call taken by: Melody Comas,  July 06, 2010 11:20 AM    Prescriptions: GLIPIZIDE 5 MG XR24H-TAB (GLIPIZIDE) 1 by mouth daily  #30 x 6   Entered by:   Linde Gillis CMA (AAMA)   Authorized by:   Hannah Beat MD   Signed by:   Linde Gillis CMA (AAMA) on 07/06/2010   Method used:   Electronically to        [facility] (retail)       [address]       [place], [place]  [address]       Ph: [phone] or [phone]       Fax: [phone]   RxID:   [id number] CRESTOR 20 MG TABS (ROSUVASTATIN CALCIUM) 1 by mouth at bedtime  #30 x 6   Entered by:   Linde Gillis CMA (AAMA)   Authorized by:   Hannah Beat MD   Signed by:   Linde Gillis CMA (AAMA) on 07/06/2010   Method used:   Electron[facility]        [facility] (retail)       [address]       [place], [place]  [address]       Ph: [phone] or [phone]       Fax: [phone]   RxID:   [id number]

## 2010-11-30 NOTE — Progress Notes (Signed)
  Phone Note Outgoing Call   Summary of Call: Please call [name] Newman - probably need to talk to Tammy.  I want to d/c his omeprazole (for reflux) and change to Protonix - similar medicine, but the omeprazole can interact sometimes with his Plavix.  Hannah Beat MD  September 02, 2010 6:02 PM   call in to their pharmacy Hannah Beat MD  September 02, 2010 6:02 PM   Follow-up for Phone Call        please assist.  med list protonix 40 mg, 1 by mouth daily, #30, 11 refills Hannah Beat MD  September 04, 2010 10:09 AM   Additional Follow-up for Phone Call Additional follow up Details #1::        Patient daughter advised via message on personal answering machine.Consuello Masse CMA   Additional Follow-up by: Benny Lennert CMA Duncan Dull),  September 06, 2010 11:35 AM    New/Updated Medications: PROTONIX 40 MG TBEC (PANTOPRAZOLE SODIUM) take one tablet daily Prescriptions: PROTONIX 40 MG TBEC (PANTOPRAZOLE SODIUM) take one tablet daily  #30 x 11   Entered by:   Benny Lennert CMA (AAMA)   Authorized by:   Hannah Beat MD   Signed by:   Benny Lennert CMA (AAMA) on 09/06/2010   Method used:   [facility]        [id number]ail[facility]       [address]       [place], [place]  [address]       Ph: [phone] or [phone]       Fax: [phone]   RxID:   [id number]   Prior Medications: CRESTOR 20 MG TABS (ROSUVASTATIN CALCIUM) 1 by mouth at bedtime OXYBUTYNIN CHLORIDE 10 MG XR24H-TAB (OXYBUTYNIN CHLORIDE) 1 by mouth daily CARVEDILOL 6.25 MG TABS (CARVEDILOL) Take one tablet by mouth twice a day CARBIDOPA-LEVODOPA 25-100 MG TABS (CARBIDOPA-LEVODOPA) 2 by mouth three times a day PLAVIX 75 MG TABS (CLOPIDOGREL BISULFATE) take one a day FUROSEMIDE 40 MG TABS (FUROSEMIDE) Take one tablet by mouth daily. BENAZEPRIL HCL 10 MG TABS (BENAZEPRIL HCL) 1 tab by mouth daily METFORMIN HCL 1000 MG TABS (METFORMIN HCL) 1 by mouth two times a day ACTOS  45 MG TABS (PIOGLITAZONE HCL) 1 by mouth daily LIDODERM 5 % PTCH (LIDOCAINE) Apply for 12 hours to affected area, then keep off for 12 hours ONGLYZA 5 MG TABS (SAXAGLIPTIN HCL) 1 by mouth daily GLIPIZIDE 5 MG XR24H-TAB (GLIPIZIDE) 1 by mouth daily UNDERPADS FOR INCONTINENCE () 788.30, dx. urinary incontinence SPIRIVA HANDIHALER 18 MCG  CAPS (TIOTROPIUM BROMIDE MONOHYDRATE) contents of one capsule inhaled daily PROAIR HFA 108 (90 BASE) MCG/ACT  AERS (ALBUTEROL SULFATE) 2 inh q4h as needed shortness of breath TAMSULOSIN HCL 0.4 MG CAPS (TAMSULOSIN HCL) 1 by mouth daily CIPRO 250 MG TABS (CIPROFLOXACIN HCL) take one by mouth two times a day x 10 days PROTONIX 40 MG TBEC (PANTOPRAZOLE SODIUM) take one tablet daily Current Allergies: LIPITOR (ATORVASTATIN CALCIUM)

## 2010-11-30 NOTE — Assessment & Plan Note (Signed)
Summary: F6M/AMD   Visit Type:  Follow-up Referring Provider:  Marca Ancona, MD Primary Provider:  Hannah Beat MD  CC:  [name] Newman. swelling in legs..  History of Present Illness:  70 year old gentleman with a past medical history of coronary artery disease, bypass surgery in January 03 with a LIMA to the LAD, sequential saphenous vein graft to the second and third obtuse marginals, saphenous vein graft to the first diagonal, sequential saphenous vein graft to the PDA and acute marginal with negative stress test in June 2010 with ejection fraction 39%, inferior, septal and inferolateral hypokinesis and scar with no ischemia, with history of falls and lower extremity weakness, history of syncope, remote smoking history who stopped in 2003, underlying diabetes who presents for routine followup.  [name] Newman reports problems with edema in his lower extremities bilaterally. He started taking an extra diuretic for several days and this seems to help his swelling. He reports that it is back to normal now. He continues to have difficulty walking with chronic right lower extremity weakness. Otherwise he reports he is about the same with no chest pain, no shortness of breath.  He does report some dizziness when he gets up in the morning. His blood pressure has been running low.    Current Medications (verified): 1)  Omeprazole 40 Mg Cpdr (Omeprazole) .... Take 1 Tablet By Mouth Once A Day 2)  Crestor 20 Mg Tabs (Rosuvastatin Calcium) .Marland Kitchen.. 1 By Mouth At Bedtime 3)  Oxybutynin Chloride 10 Mg Xr24h-Tab (Oxybutynin Chloride) .Marland Kitchen.. 1 By Mouth Daily 4)  Carvedilol 6.25 Mg Tabs (Carvedilol) .... Take One Tablet By Mouth Twice A Day 5)  Carbidopa-Levodopa 25-100 Mg Tabs (Carbidopa-Levodopa) .... 2 By Mouth Three Times A Day 6)  Plavix 75 Mg Tabs (Clopidogrel Bisulfate) .... Take One A Day 7)  Furosemide 40 Mg Tabs (Furosemide) .... Take One Tablet By Mouth Daily. 8)  Benazepril Hcl 10 Mg Tabs (Benazepril  Hcl) .Marland Kitchen.. 1 Tab By Mouth Daily 9)  Metformin Hcl 1000 Mg Tabs (Metformin Hcl) .Marland Kitchen.. 1 By Mouth Two Times A Day 10)  Actos 45 Mg Tabs (Pioglitazone Hcl) .Marland Kitchen.. 1 By Mouth Daily 11)  Lidoderm 5 % Ptch (Lidocaine) .... Apply For 12 Hours To Affected Area, Then Keep Off For 12 Hours 12)  Onglyza 5 Mg Tabs (Saxagliptin Hcl) .Marland Kitchen.. 1 By Mouth Daily 13)  Glipizide 5 Mg Xr24h-Tab (Glipizide) .Marland Kitchen.. 1 By Mouth Daily 14)  Underpads For Incontinence .Marland Kitchen.. 788.30, Dx. Urinary Incontinence 15)  Spiriva Handihaler 18 Mcg  Caps (Tiotropium Bromide Monohydrate) .... Contents of One Capsule Inhaled Daily 16)  Proair Hfa 108 (90 Base) Mcg/act  Aers (Albuterol Sulfate) .... 2 Inh Q4h As Needed Shortness of Breath  Allergies (verified): 1)  Lipitor (Atorvastatin Calcium)  Past History:  Past Medical History: Last updated: 05/27/2010 medical noncompliance. 1. Prior back surgery. 2. Type 2 diabetes with poor compliance 3. Hypertension. 4. Coronary artery disease status post MI in 2003 followed by coronary artery bypass grafting in January 2003.  The patient has a LIMA to the LAD, sequential saphenous vein graft to the second and third obtuse marginals, saphenous vein graft to the first diagonal, and a sequential saphenous vein graft to the PDA and an acute marginal.  Ad myoview 6/10 showed EF 39%, inferior/septal/inferolateral hypokinesis and scar, no ischemia. 5. CHF:   Echo (7/10) was technically difficult.  EF was depressed but hard to tell how severe.  Possible mild aortic stenosis.  Adenosine myoview (6/10) showed EF 39%.  6. History of falls. 7. Hyperlipidemia. 8. 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  9. Low back pain. 10.Hip pain. 11.GERD 12.Prior smoker. 13.History of syncope. 14.Nephrolithiasis. 15. 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 INSOMNIA NOS (ICD-780.52) 20. ANEMIA, IRON DEFICIENCY, UNSPEC. (ICD-280.9) 21. Aortic stenosis: mild.   COPD  Past Surgical History: Last updated: 05/27/2009 3v CABG - 10/31/2001 4 Spine surgeries, pt. unclear what occurred 1980s - 2000's cath - ef 35%, 3v dz - 10/31/2001 echo - ef 30%, mod RA/LA/LV enlargemt, mod MR/TR/AI - 05/01/2003 EYE IMPLANTS? - L4-5 extraforaminal microdiscectomy - 07/02/2003  MRI brain - chronic sm v ischemic changes & diffuse atrophy - 12/29/2001 MRI spine - lg R paracentral L4-5 disc protrusion, severe narrowing of neural foramen - 04/30/2002 R hip - mild degenerative changes - 02/28/2002 spinal facet blocks 12/03, 1/04, 3/04 - 12/30/2002 umb hernia repair - 07/31/2002 MRI brain, 04/2009, no infarct HOSP: 04/2009: found down, no CVA, ? normal pressure hydrocephalus  Family History: Last updated: 12/28/2006 3 OTHER SISTERS ARE HEALTHY, DAD DIED OF EMPHYSEMA - AGE 42`S, MOM DIED OF HEART DZ, DM - AGE 24, SISTER DIED OF EMPHYSEMA - AGE 69  Social History: Last updated: 08/19/2009 SEPARATED FROM WIFE, 2 SONS, 2 DTR'S.  DISABLED PLUMBER SINCE 2002.   LIV[place] IN [place] W/ CATS, COWS, TURKEYS, GOATS, DONKEYS.   QUIT SMOKING 1990, FORMERLY 4-5PPD X 68YRS   NO ETOH. Ex-wife, [place] Citizen, in and out of the picture CANNOT READ AND WRITE  Risk Factors: Exercise: no (12/01/2008)  Risk Factors: Smoking Status: current (09/23/2008) Cans of tobacco/wk: yes (05/10/2007) Passive Smoke Exposure: yes (12/01/2008)  Review of Systems       The patient complains of peripheral edema.  The patient denies fever, weight loss, weight gain, vision loss, decreased hearing, hoarseness, chest pain, syncope, dyspnea on exertion, prolonged cough, abdominal pain, incontinence, muscle weakness, depression, and enlarged lymph nodes.    Vital Signs:  Patient profile:   70 year old male Height:      68 inches Weight:      188.75 pounds Pulse rate:   71 / minute BP sitting:   98 / 61  (left arm) Cuff size:   regular  Vitals Entered By: Bishop Dublin, CMA (June 16, 2010 10:59 AM)  Physical  Exam  General:  alert, well-developed, and well-nourished.  Presents in a wheelchair Head:  Normocephalic and atraumatic without obvious abnormalities.  Neck:  No deformities, masses, or tenderness noted. Lungs:  Normal respiratory effort, chest expands symmetrically. Lungs are clear to auscultation, no crackles or wheezes. Heart:  Non-displaced PMI, chest non-tender; regular rate and rhythm, S1, S2 with III/VI SEM RSB, no rubs or gallops. Carotid upstroke normal, no bruit.  Pedals normal pulses. Trace LE  edema b/l, no varicosities. Abdomen:  Bowel sounds positive; abdomen soft and non-tender without masses Msk:  Back normal, normal gait. Muscle strength and tone normal. Pulses:  pulses normal in all 4 extremities Extremities:  No clubbing or cyanosis. Neurologic:  Alert and oriented x 3. Skin:  Intact without lesions or rashes. Psych:  Normal affect.   Impression & Recommendations:  Problem # 1:  EDEMA- LOCALIZED (ICD-782.3) his mild edema that has improved with diuresis is likely due to underlying systolic heart failure. He has improved to his baseline with extra Lasix p.r.n. We have encouraged him to continue on his Lasix 40 mg daily with extra p.r.n. doses with potassium as needed for worsening lower extremity edema. His family reports that he uses  a significant amount of sodium/salt. We have talked to him about the downside of using extra seasoning.  Problem # 2:  CORONARY ARTERY DISEASE (ICD-414.00) history of severe coronary artery disease, negative stress test last year. No symptoms of chest pain currently. He is not very active due to the underlying leg weakness.  His blood pressure is low today and has been over the past 2 months. We have suggested that he change his benazepril to the evening. If he has any more dizziness in the morning we could cut his benazepril in half.  His updated medication list for this problem includes:    Carvedilol 6.25 Mg Tabs (Carvedilol) .Marland Kitchen...  Take one tablet by mouth twice a day    Plavix 75 Mg Tabs (Clopidogrel bisulfate) .Marland Kitchen... Take one a day    Benazepril Hcl 10 Mg Tabs (Benazepril hcl) .Marland Kitchen... 1 tab by mouth daily

## 2010-11-30 NOTE — Progress Notes (Signed)
Summary: can't urinate  Phone Note Call from Patient Call back at Home Phone [phone]   Caller: Lowella Bandy- Patient's aid- [phone] Summary of Call: Lowella Bandy says that she has not been able to get a urine from the patient since leaving here. She says that he feels as if he needs to go, but can't and she leaves at 3:00. She is aslo worried that he may have a blockage because his feet are more swollen than before. She wants to know what she should do about the urine. I told her it would be okay to put in fridge later today when he is able and that it could be brought back tomorrow, but she says that he want remember to do so. Please advise.  Initial call taken by: Melody Comas,  May 27, 2010 2:38 PM  Follow-up for Phone Call        can you please call this patient. urethral blockage should not cause lower extremity edema. I suspect his edema would be from a different cause even if a blockage were present.   Call in Flomax 0.4 mg, 2 tabs by mouth now. Then 1 by mouth daily. If he has not urinated by tonight then he has to go to the ER to get checked for potential stones or outflow obstruction.  Follow-up by: Hannah Beat MD,  May 27, 2010 2:43 PM  Additional Follow-up for Phone Call Additional follow up Details #1::        Alda Lea, she will call pt's daughter to let her know.  [facility], # 30/ 0 refills. Additional Follow-up by: Lowella Petties CMA,  May 27, 2010 2:57 PM     Prior Medications: OMEPRAZOLE 40 MG CPDR (OMEPRAZOLE) Take 1 tablet by mouth once a day CRESTOR 20 MG TABS (ROSUVASTATIN CALCIUM) 1 by mouth at bedtime OXYBUTYNIN CHLORIDE 10 MG XR24H-TAB (OXYBUTYNIN CHLORIDE) 1 by mouth daily CARVEDILOL 6.25 MG TABS (CARVEDILOL) Take one tablet by mouth twice a day CARBIDOPA-LEVODOPA 25-100 MG TABS (CARBIDOPA-LEVODOPA) 2 by mouth three times a day PLAVIX 75 MG TABS (CLOPIDOGREL BISULFATE) take one a day FUROSEMIDE 40 MG TABS (FUROSEMIDE) Take one  tablet by mouth daily. BENAZEPRIL HCL 10 MG TABS (BENAZEPRIL HCL) 1 tab by mouth daily METFORMIN HCL 1000 MG TABS (METFORMIN HCL) 1 by mouth two times a day ACTOS 45 MG TABS (PIOGLITAZONE HCL) 1 by mouth daily LIDODERM 5 % PTCH (LIDOCAINE) Apply for 12 hours to affected area, then keep off for 12 hours ONGLYZA 5 MG TABS (SAXAGLIPTIN HCL) 1 by mouth daily GLIPIZIDE 5 MG XR24H-TAB (GLIPIZIDE) 1 by mouth daily MIRTAZAPINE 15 MG TABS (MIRTAZAPINE) 1 by mouth daily UNDERPADS FOR INCONTINENCE () 788.30, dx. urinary incontinence Current Allergies: LIPITOR (ATORVASTATIN CALCIUM)

## 2010-11-30 NOTE — Assessment & Plan Note (Signed)
Summary: 3 MONTH FOLLOW UP/[facility]   Vital Signs:  Patient profile:   70 year old male Height:      68 inches Weight:      190.0 pounds BMI:     28.99 Temp:     99.1 degrees F oral Pulse rate:   72 / minute Pulse rhythm:   regular BP sitting:   120 / 78  (left arm) Cuff size:   regular  Vitals Entered By: Benny Lennert CMA Duncan Dull) (August 23, 2010 11:38 AM)  History of Present Illness: Chief complaint 3 month follow up   recheck BMP UA with micro  DM: BS good, record reviewed, low 100's, rare 200 with poor diet  HTN, stable now. No orthostatic episodes post hospitalization  f/u hematuria with gross hematuria.  h/o hematuria, micro, in hosp, with a history of macroscopic hematuria as well, intermittent.   hosp f/u, urosepsis, and second hosp f/u, ? TIA vs. orthostasis  CHF, no sob, mild fluid. Not weighing. Reports compliance.  R arm AK: unclear of how long this has been present. Scaly, healing area, approx 1 cm.  Contraindications/Deferment of Procedures/Staging:    Test/Procedure: FLU VAX    Reason for deferment: patient declined   Allergies: 1)  Lipitor (Atorvastatin Calcium)  Past History:  Past medical, surgical, family and social histories (including risk factors) reviewed, and no changes noted (except as noted below).  Past Medical History: Reviewed history from 05/27/2010 and no changes required. medical noncompliance. 1. Prior back surgery. 2. Type 2 diabetes with poor compliance 3. Hypertension. 4. Coronary artery disease status post MI in 2003 followed by coronary artery bypass grafting in January 2003.  The patient has a LIMA to the LAD, sequential saphenous vein graft to the second and third obtuse marginals, saphenous vein graft to the first diagonal, and a sequential saphenous vein graft to the PDA and an acute marginal.  Ad myoview 6/10 showed EF 39%, inferior/septal/inferolateral hypokinesis and scar, no ischemia. 5. CHF:   Echo (7/10) was  technically difficult.  EF was depressed but hard to tell how severe.  Possible mild aortic stenosis.  Adenosine myoview (6/10) showed EF 39%.  6. History of falls. 7. Hyperlipidemia. 8. 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  9. Low back pain. 10.Hip pain. 11.GERD 12.Prior smoker. 13.History of syncope. 14.Nephrolithiasis. 15. 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 INSOMNIA NOS (ICD-780.52) 20. ANEMIA, IRON DEFICIENCY, UNSPEC. (ICD-280.9) 21. Aortic stenosis: mild.  COPD  Past Surgical History: 3v CABG - 10/31/2001 4 Spine surgeries, pt. unclear what occurred 1980s - 2000's cath - ef 35%, 3v dz - 10/31/2001 echo - ef 30%, mod RA/LA/LV enlargemt, mod MR/TR/AI - 05/01/2003 EYE IMPLANTS? - L4-5 extraforaminal microdiscectomy - 07/02/2003  MRI brain - chronic sm v ischemic changes & diffuse atrophy - 12/29/2001 MRI spine - lg R paracentral L4-5 disc protrusion, severe narrowing of neural foramen - 04/30/2002 R hip - mild degenerative changes - 02/28/2002 spinal facet blocks 12/03, 1/04, 3/04 - 12/30/2002 umb hernia repair - 07/31/2002 MRI brain, 04/2009, no infarct HOSP: 04/2009: found down, no CVA, ? normal pressure hydrocephalus HOSP: 07/2010, urosepsis x 1, ?TIA vs. orthostasis #2  Family History: Reviewed history from 12/28/2006 and no changes required. 3 OTHER SISTERS ARE HEALTHY, DAD DIED OF EMPHYSEMA - AGE 4`S, MOM DIED OF HEART DZ, DM - AGE 33, SISTER DIED OF EMPHYSEMA - AGE 29  Social History: Reviewed history from 08/19/2009 and no changes required. SEPARATED FROM WIFE, 2 SONS, 2 DTR'S.  DISABLED PLUMBER SINCE 2002.   LIVES IN [place] W/ CATS, COWS, TURKEYS, GOATS, DONKEYS.   QUIT SMOKING 1990, FORMERLY 4-5PPD X 105YRS   NO ETOH. Ex-wife, [place] Citizen, in and out of the picture CANNOT READ AND WRITE  Review of Systems      See HPI General:  Denies chills, fatigue, and fever. CV:  Complains of swelling of feet; denies chest pain or  discomfort and shortness of breath with exertion. Resp:  Denies cough, shortness of breath, and wheezing.  Physical Exam  General:  alert, well-developed, and well-nourished.  Head:  Normocephalic and atraumatic without obvious abnormalities. No apparent alopecia or balding. Ears:  no external deformities.   Nose:  no external deformity.   Neck:  No deformities, masses, or tenderness noted. Lungs:  Normal respiratory effort, chest expands symmetrically. Lungs are clear to auscultation, no crackles or wheezes. Heart:  Normal rate and regular rhythm. III/VI SEM. S1 and S2 normal without gallop, click, rub or other extra sounds. Psych:  Cognition and judgment appear intact. Alert and cooperative with normal attention span and concentration. No apparent delusions, illusions, hallucinations  Diabetes Management Exam:    Foot Exam (with socks and/or shoes not present):       Sensory-Pinprick/Light touch:          Left medial foot (L-4): normal          Left dorsal foot (L-5): normal          Left lateral foot (S-1): normal          Right medial foot (L-4): normal          Right dorsal foot (L-5): normal          Right lateral foot (S-1): normal       Sensory-Monofilament:          Left foot: normal          Right foot: normal       Inspection:          Left foot: normal          Right foot: normal       Nails:          Left foot: normal          Right foot: normal   Impression & Recommendations:  Problem # 1:  DIABETES MELLITUS II, UNCOMPLICATED (ICD-250.00) Assessment Improved  His updated medication list for this problem includes:    Benazepril Hcl 10 Mg Tabs (Benazepril hcl) .Marland Kitchen... 1 tab by mouth daily    Metformin Hcl 1000 Mg Tabs (Metformin hcl) .Marland Kitchen... 1 by mouth two times a day    Actos 45 Mg Tabs (Pioglitazone hcl) .Marland Kitchen... 1 by mouth daily    Onglyza 5 Mg Tabs (Saxagliptin hcl) .Marland Kitchen... 1 by mouth daily    Glipizide 5 Mg Xr24h-tab (Glipizide) .Marland Kitchen... 1 by mouth  daily  Orders: TLB-A1C / Hgb A1C (Glycohemoglobin) (83036-A1C)  Problem # 2:  GROSS HEMATURIA (ICD-599.71) Smoker, very heavy with h/o recurrent gross hematuria. Also with history of stones -- cannot r/o bladder CA. Has a urologist, may need eval and potential cystoscopy. Recheck micro, but even if negative, needs eval.  Orders: Urology Referral (Urology) Venipuncture [phone]) TLB-Udip w/ Micro (81001-URINE)  Problem # 3:  CONGESTIVE HEART FAILURE, UNSPECIFIED (ICD-428.0)  His updated medication list for this problem includes:    Carvedilol 6.25 Mg Tabs (Carvedilol) .Marland Kitchen... Take one tablet by mouth twice a day    Plavix 75 Mg Tabs (Clopidogrel bisulfate) .Marland KitchenMarland KitchenMarland KitchenMarland Kitchen  Take one a day    Furosemide 40 Mg Tabs (Furosemide) .Marland Kitchen... Take one tablet by mouth daily.    Benazepril Hcl 10 Mg Tabs (Benazepril hcl) .Marland Kitchen... 1 tab by mouth daily  Orders: TLB-BMP (Basic Metabolic Panel-BMET) (80048-METABOL) TLB-CBC Platelet - w/Differential (85025-CBCD)  Problem # 4:  HYPERTENSION, BENIGN (ICD-401.1)  His updated medication list for this problem includes:    Carvedilol 6.25 Mg Tabs (Carvedilol) .Marland Kitchen... Take one tablet by mouth twice a day    Furosemide 40 Mg Tabs (Furosemide) .Marland Kitchen... Take one tablet by mouth daily.    Benazepril Hcl 10 Mg Tabs (Benazepril hcl) .Marland Kitchen... 1 tab by mouth daily  Problem # 5:  ACTINIC KERATOSIS (ICD-702.0)  R forearm Cryosurgery x 20 sec, then repeated x 10 sec.  Orders: Cryotherapy/Destruction benign or premalignant lesion (1st lesion)  (17000)  Complete Medication List: 1)  Omeprazole 40 Mg Cpdr (Omeprazole) .... Take 1 tablet by mouth once a day 2)  Crestor 20 Mg Tabs (Rosuvastatin calcium) .Marland Kitchen.. 1 by mouth at bedtime 3)  Oxybutynin Chloride 10 Mg Xr24h-tab (Oxybutynin chloride) .Marland Kitchen.. 1 by mouth daily 4)  Carvedilol 6.25 Mg Tabs (Carvedilol) .... Take one tablet by mouth twice a day 5)  Carbidopa-levodopa 25-100 Mg Tabs (Carbidopa-levodopa) .... 2 by mouth three times a day 6)   Plavix 75 Mg Tabs (Clopidogrel bisulfate) .... Take one a day 7)  Furosemide 40 Mg Tabs (Furosemide) .... Take one tablet by mouth daily. 8)  Benazepril Hcl 10 Mg Tabs (Benazepril hcl) .Marland Kitchen.. 1 tab by mouth daily 9)  Metformin Hcl 1000 Mg Tabs (Metformin hcl) .Marland Kitchen.. 1 by mouth two times a day 10)  Actos 45 Mg Tabs (Pioglitazone hcl) .Marland Kitchen.. 1 by mouth daily 11)  Lidoderm 5 % Ptch (Lidocaine) .... Apply for 12 hours to affected area, then keep off for 12 hours 12)  Onglyza 5 Mg Tabs (Saxagliptin hcl) .Marland Kitchen.. 1 by mouth daily 13)  Glipizide 5 Mg Xr24h-tab (Glipizide) .Marland Kitchen.. 1 by mouth daily 14)  Underpads For Incontinence  .Marland Kitchen.. 788.30, dx. urinary incontinence 15)  Spiriva Handihaler 18 Mcg Caps (Tiotropium bromide monohydrate) .... Contents of one capsule inhaled daily 16)  Proair Hfa 108 (90 Base) Mcg/act Aers (Albuterol sulfate) .... 2 inh q4h as needed shortness of breath 17)  Tamsulosin Hcl 0.4 Mg Caps (Tamsulosin hcl) .Marland Kitchen.. 1 by mouth daily  Other Orders: Influenza Vaccine MCR (69629)  Patient Instructions: 1)  f/u 3 months   Orders Added: 1)  Urology Referral [Urology] 2)  Venipuncture [52841] 3)  TLB-BMP (Basic Metabolic Panel-BMET) [80048-METABOL] 4)  TLB-CBC Platelet - w/Differential [85025-CBCD] 5)  TLB-A1C / Hgb A1C (Glycohemoglobin) [83036-A1C] 6)  Influenza Vaccine MCR [00025] 7)  TLB-Udip w/ Micro [81001-URINE] 8)  Est. Patient Level IV [32440] 9)  Cryotherapy/Destruction benign or premalignant lesion (1st lesion)  [17000]   Immunizations Administered:  Influenza Vaccine # 1:    Vaccine Type: Fluvax MCR    Site: right deltoid    Mfr: GlaxoSmithKline    Dose: 0.5 ml    Route: IM    Given by: Benny Lennert CMA (AAMA)    Exp. Date: 04/30/2011    Lot #: [id number]    VIS given: 05/25/10 version given August 23, 2010.  Flu Vaccine Consent Questions:    Do you have a history of severe allergic reactions to this vaccine? no    Any prior history of allergic reactions to egg  and/or gelatin? no    Do you have a sensitivity to the preservative  Thimersol? no    Do you have a past history of Guillan-Barre Syndrome? no    Do you currently have an acute febrile illness? no    Have you ever had a severe reaction to latex? no    Vaccine information given and explained to patient? yes   Immunizations Administered:  Influenza Vaccine # 1:    Vaccine Type: Fluvax MCR    Site: right deltoid    Mfr: GlaxoSmithKline    Dose: 0.5 ml    Route: IM    Given by: Benny Lennert CMA (AAMA)    Exp. Date: 04/30/2011    Lot #: [id number]    VIS given: 05/25/10 version given August 23, 2010.       Diagnosis: Actinic Keratoses Destruction: Cryosurgery Body area(s) R forearm Number of Lesions: 1 Anesthesia: None Disposition: To home

## 2010-11-30 NOTE — Assessment & Plan Note (Signed)
Summary: SWELLING IN FEET   Visit Type:  rov Referring Provider:  Marca Ancona, MD Primary Provider:  Hannah Beat MD  CC:  no cp, sob dx with broncitis last week, and edema in right ankle - isn't any better.  History of Present Illness:  70 year old gentleman with a past medical history of coronary artery disease, bypass surgery in January 03 with a LIMA to the LAD, sequential saphenous vein graft to the second and third obtuse marginals, saphenous vein graft to the first diagonal, sequential saphenous vein graft to the PDA and acute marginal with negative stress test in June 2010 with ejection fraction 39%, inferior, septal and inferolateral hypokinesis and scar with no ischemia, with history of falls and lower extremity weakness, history of syncope, remote smoking history who stopped in 2003, underlying diabetes who presents for routine followup.  [name] Newman states that overall he is doing well. He has qualified for more help at home with home nursing, approximately 6 hours per day. He states that his legs up and getting weaker and he is unable to walk now with his walker. He denies any shortness of breath, no chest pain. No significant lower extremity edema. He is very inactive and sits in a chair most of the day. He has been recovering from an upper respiratory infection and had a course of antibiotics. He states that his sugars have been running typically between 1:30 and 150 on a routine basis.  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 of Normal Pressure Hydrocephalus  (ICD-331.3) 10)  Insomnia  Nos  (ICD-780.52) 11)  Hypertension, Benign Systemic  (ICD-401.1) 12)  Hyperlipidemia  (ICD-272.4) 13)  Gastroesophageal Reflux, No Esophagitis  (ICD-530.81) 14)  Diabetes Mellitus II, Uncomplicated  (ICD-250.00) 15)  Anxiety  (ICD-300.00) 16)  Anemia, Iron Deficiency, Unspec.  (ICD-280.9) 17)  Cerebrovascular Accident, Hx of  (ICD-V12.50) 18)  Coronary Artery Disease  (ICD-414.00) 19)  Congestive Heart Failure  (ICD-428.0)  Current Medications (verified): 1)  Omeprazole 20 Mg Cpdr (Omeprazole) .... Take 1 Capsule By Mouth Once A Day 2)  Crestor 20 Mg Tabs (Rosuvastatin Calcium) .Marland Kitchen.. 1 By Mouth At Bedtime 3)  Bd Insulin Syringe 29g X 1/2" 1 Ml  Misc (Insulin Syringe-Needle U-100) .... Use As Directed To Inject Insulin 4)  Detrol La 4 Mg Xr24h-Cap (Tolterodine Tartrate) .Marland Kitchen.. 1 By Mouth Daily 5)  Lantus Solustar Ultra Fine Needles .... Use As Directed 6)  Carvedilol 6.25 Mg Tabs (Carvedilol) .... Take One Tablet By Mouth Twice A Day 7)  Carbidopa-Levodopa 25-100 Mg Tabs (Carbidopa-Levodopa) .Marland Kitchen.. 1 By Mouth Three Times A Day 8)  Plavix 75 Mg Tabs (Clopidogrel Bisulfate) .... Take One A Day 9)  Furosemide 20 Mg Tabs (Furosemide) .Marland Kitchen.. 1 Tablet Daily 10)  Benazepril Hcl 10 Mg Tabs (Benazepril Hcl) .Marland Kitchen.. 1 Tab By Mouth Daily 11)  Metformin Hcl 1000 Mg Tabs (Metformin Hcl) .Marland Kitchen.. 1 By Mouth Two Times A Day 12)  Lantus 100 Unit/ml Soln (Insulin Glargine) .Marland Kitchen.. 14 Units [place] At Bedtime, Disp Qs 13)  Power Mobility Device .... Date of Face To Face Exam: 09/21/2009 Length of Need: 99 (Lifetime) Weight=201 Pounds  Icd-9 Codes: 428.0, 250.60, 719.45, 331.3, V12.50, 414.0 14)  Avelox 400 Mg Tabs (Moxifloxacin Hcl) .Marland Kitchen.. 1 By Mouth Once Daily 15)  Combivent 18-103 Mcg/act Aero (Ipratropium-Albuterol) .... As Directed  Allergies (verified): 1)  Lipitor (Atorvastatin Calcium)  Past History:  Past Medical History: Last updated: 09/21/2009 1. Prior back surgery. 2. Type 2 diabetes with poor compliance 3.  Hypertension. 4. Coronary artery disease status post MI in 2003 followed by coronary artery bypass grafting in January 2003.  The patient has a LIMA to the LAD, sequential saphenous vein graft to the second and third obtuse marginals, saphenous vein graft to the first diagonal, and a sequential saphenous vein graft to the PDA and an acute marginal.  Ad myoview 6/10 showed EF 39%, inferior/septal/inferolateral hypokinesis and scar, no ischemia. 5. CHF:   Echo (7/10) was technically difficult.  EF was depressed but hard to tell how severe.  Possible mild aortic stenosis.  Adenosine myoview (6/10) showed EF 39%.  6. History of falls. 7. Hyperlipidemia. 8. 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  9. Low back pain. 10.Hip pain. 11.GERD 12.Prior smoker. 13.History of syncope. 14.Nephrolithiasis. 15. 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 INSOMNIA NOS (ICD-780.52) 20. ANEMIA, IRON DEFICIENCY, UNSPEC. (ICD-280.9) 21. Aortic stenosis: mild.   Past Surgical History: Last updated: 05/27/2009 3v CABG - 10/31/2001 4 Spine surgeries, pt. unclear what occurred 1980s - 2000's cath - ef 35%, 3v dz - 10/31/2001 echo - ef 30%, mod RA/LA/LV enlargemt, mod MR/TR/AI - 05/01/2003 EYE IMPLANTS? - L4-5 extraforaminal microdiscectomy - 07/02/2003  MRI brain - chronic sm v ischemic changes & diffuse atrophy - 12/29/2001 MRI spine - lg R paracentral L4-5 disc protrusion, severe narrowing of neural foramen - 04/30/2002 R hip - mild degenerative changes - 02/28/2002 spinal facet blocks 12/03, 1/04, 3/04 - 12/30/2002 umb hernia repair - 07/31/2002 MRI brain, 04/2009, no infarct HOSP: 04/2009: found down, no CVA, ? normal pressure hydrocephalus  Family History: Last updated: 12/28/2006 3 OTHER SISTERS ARE HEALTHY, DAD DIED OF EMPHYSEMA - AGE 37`S, MOM DIED OF HEART DZ, DM - AGE 16, SISTER DIED OF EMPHYSEMA - AGE 32  Social History: Last updated: 08/19/2009 SEPARATED FROM WIFE, 2  SONS, 2 DTR'S.  DISABLED PLUMBER SINCE 2002.   LIVES IN [place] W/ CATS, COWS, TURKEYS, GOATS, DONKEYS.   QUIT SMOKING 1990, FORMERLY 4-5PPD X 86YRS   NO ETOH. Ex-wife, [place] Citizen, in and out of the picture CANNOT READ AND WRITE  Risk Factors: Exercise: no (12/01/2008)  Risk Factors: Smoking Status: current (09/23/2008) Cans of tobacco/wk: yes (05/10/2007) Passive Smoke Exposure: yes (12/01/2008)  Vital Signs:  Patient profile:   70 year old male Height:      68 inches Weight:      195.50 pounds BMI:     29.83 Pulse rate:   75 / minute Pulse rhythm:   regular BP sitting:   154 / 92  (right arm) Cuff size:   regular  Vitals Entered By: Mercer Pod (December 14, 2009 2:41 PM)  Physical Exam  General:  elderly gentleman sitting in a wheelchair, alert oriented x3, no apparent distress, HEENT exam is benign, oropharynx is clear, neck is supple with no JVP or carotid bruits, heart sounds are regular with normal S1-S2 and no murmurs appreciated the lungs are clear to auscultation with no wheezes or rales, abdominal exam is benign, has no significant lower extremity edema, right leg is in a brace below the knee, neurologic exam is grossly nonfocal before exam was not  completed, pulses are equal and symmetrical in his upper and lower extremities, skin is warm and dry.   Impression & Recommendations:  Problem # 1:  CORONARY ARTERY DISEASE (ICD-414.00) history of MI in 2003,  bypass surgery at that time as detailed above. No new symptoms of angina. he is on aspirin and Plavix, excellent lipid control. He is on a proton pump inhibitor as his surgery he was 7 years ago, we'll continue him on his current medication regimen to not change him to effient. Stress test in June 2010 showed no ischemia.  His updated medication list for this problem includes:    Carvedilol 6.25 Mg Tabs (Carvedilol) .Marland Kitchen... Take one tablet by mouth twice a day    Plavix 75 Mg Tabs (Clopidogrel bisulfate) .Marland Kitchen...  Take one a day    Benazepril Hcl 10 Mg Tabs (Benazepril hcl) .Marland Kitchen... 1 tab by mouth daily  Problem # 2:  CONGESTIVE HEART FAILURE (ICD-428.0) No signs of heart failure on today's visit. He'll continue him on his current medication regimen and have his nursing aid check his blood pressures. His updated medication list for this problem includes:    Carvedilol 6.25 Mg Tabs (Carvedilol) .Marland Kitchen... Take one tablet by mouth twice a day    Plavix 75 Mg Tabs (Clopidogrel bisulfate) .Marland Kitchen... Take one a day    Furosemide 20 Mg Tabs (Furosemide) .Marland Kitchen... 1 tablet daily    Benazepril Hcl 10 Mg Tabs (Benazepril hcl) .Marland Kitchen... 1 tab by mouth daily  Problem # 3:  DIABETES MELLITUS II, UNCOMPLICATED (ICD-250.00) we have asked him to watch his diet and an effort to improve his sugars. Dr. Dallas Schimke is managing his diabetes.  His updated medication list for this problem includes:    Benazepril Hcl 10 Mg Tabs (Benazepril hcl) .Marland Kitchen... 1 tab by mouth daily    Metformin Hcl 1000 Mg Tabs (Metformin hcl) .Marland Kitchen... 1 by mouth two times a day    Lantus 100 Unit/ml Soln (Insulin glargine) .Marland Kitchen... 14 units [place] at bedtime, disp qs  Problem # 4:  HYPERLIPIDEMIA (ICD-272.4) Cholesterol in September 2010 was well controlled, total cholesterol 1:15, LDL 60, HDL 34. His updated medication list for this problem includes:    Crestor 20 Mg Tabs (Rosuvastatin calcium) .Marland Kitchen... 1 by mouth at bedtime  Problem # 5:  HYPERTENSION, BENIGN (ICD-401.1) Blood pressure was elevated today though the patient states that it is well controlled at home. We have asked him to have his nursing aid check his blood pressures through the week.  His updated medication list for this problem includes:    Carvedilol 6.25 Mg Tabs (Carvedilol) .Marland Kitchen... Take one tablet by mouth twice a day    Furosemide 20 Mg Tabs (Furosemide) .Marland Kitchen... 1 tablet daily    Benazepril Hcl 10 Mg Tabs (Benazepril hcl) .Marland Kitchen... 1 tab by mouth daily  Patient Instructions: 1)  Your physician recommends that you  schedule a follow-up appointment in: 6 months

## 2010-11-30 NOTE — Progress Notes (Signed)
Summary: needs order faxed for underpads  Phone Note From Other Clinic   Caller: Darl Pikes with [facility]  [phone] Summary of Call: [facility] case worker is asking for an order for underpads, pt needs # 120 a month, dx of incontinence.  Please fax to [phone].  This was done last month but case worker says she didnt get fax.       Lowella Petties CMA  Mar 08, 2010 4:18 PM   Follow-up for Phone Call        please do Follow-up by: Hannah Beat MD,  Mar 08, 2010 4:50 PM  Additional Follow-up for Phone Call Additional follow up Details #1::        done.Consuello Masse CMA  Additional Follow-up by: Benny Lennert CMA Duncan Dull),  Mar 08, 2010 4:52 PM    New/Updated Medications: * UNDERPADS FOR INCONTINENCE 788.30, dx. urinary incontinence Prescriptions: UNDERPADS FOR INCONTINENCE 788.30, dx. urinary incontinence  #120 x 0   Entered and Authorized by:   Hannah Beat MD   Signed by:   Hannah Beat MD on 03/08/2010   Method used:   Printed then faxed to ...       [facility]  [place] (retail)       [address]       [place], [place]  [address]       Ph: [phone] or [phone]       Fax: [phone]   RxID:   [id number]

## 2010-11-30 NOTE — Progress Notes (Signed)
Summary: undergarments  Phone Note Other Incoming   Caller: [facility] Summary of Call: Darl Pikes  needs order for undergaments (Adult size) faxed to [phone]. Initial call taken by: Melody Comas,  December 08, 2009 12:13 PM  Follow-up for Phone Call        Done in outbox. Follow-up by: Kerby Nora MD,  December 08, 2009 2:19 PM  Additional Follow-up for Phone Call Additional follow up Details #1::        Faxed Rx to [facility] Additional Follow-up by: Linde Gillis CMA Duncan Dull),  December 08, 2009 2:36 PM     Appended Document: undergarments Darl Pikes called back and needs new Rx for Adult Undergarments, #30.  The previous Rx that was faxed is not acceptable per her supervisor because it doesn't have quanity.  Fax new Rx to [phone].  Call Darl Pikes at [phone] if you have any other question.    Appended Document: undergarments Please include diagnosis 788.30  Appended Document: undergarments Rx on you desk  Appended Document: undergarments faxed to susan

## 2010-11-30 NOTE — Letter (Signed)
Summary: Generic Letter   at [facility]  [address] [place], [place] [address]   Phone: [phone]  Fax: [phone]      12/18/2009     [name] Newman [address] [place], [place]  [address]    Dear Mr. MIN,   Blood sugar fairly high when you were in the office. The average number is 8 (should be less than 7). I want to try to eliminate as much spiking as possible and simplify everything.   We should reassess everything in a few weeks. Kidneys, liver, blood counts, thyroid look ok          Sincerely,   Benny Lennert CMA (AAMA)

## 2010-11-30 NOTE — Letter (Signed)
Summary: [facility]  [facility]   Imported By: Sherian Rein 10/05/2010 11:47:26  _____________________________________________________________________  External Attachment:    Type:   Image     Comment:   External Document  Appended Document: [facility] update sinemet dosing in med list  Appended Document: [facility] done.Consuello Masse CMA

## 2010-11-30 NOTE — Progress Notes (Signed)
Summary: acid reflux   Phone Note Call from Patient Call back at [phone]   Caller: Daughter- [name] Newman  Call For: [name] Hampshire Copland MD/ Dr. Ermalene Searing Summary of Call: Daughter says that patient's acid reflux has been so bad for the past week that he has hardly been able to eat. They are wanting to know if he can have his omeprazole increased to 40 mg. [facility]  Initial call taken by: Melody Comas,  January 15, 2010 11:36 AM  Follow-up for Phone Call        ys..will call in, but if not improving in next few days or if SOB, exertional chest pain needs to be seen ASAP.  Follow-up by: Kerby Nora MD,  January 15, 2010 11:43 AM  Additional Follow-up for Phone Call Additional follow up Details #1::        patients daughter advised.Consuello Masse CMA  Additional Follow-up by: Benny Lennert CMA Duncan Dull),  January 15, 2010 12:22 PM    New/Updated Medications: OMEPRAZOLE 40 MG CPDR (OMEPRAZOLE) Take 1 tablet by mouth once a day Prescriptions: OMEPRAZOLE 40 MG CPDR (OMEPRAZOLE) Take 1 tablet by mouth once a day  #30 x 3   Entered and Authorized by:   Kerby Nora MD   Signed by:   Kerby Nora MD on 01/15/2010   Method used:   [facility]        [facility] (retail)       [address]       [place], [place]  [address]       Ph: [phone] or [phone]       Fax: [phone]   RxID:   [id number]   Current Allergies (reviewed today): LIPITOR (ATORVASTATIN CALCIUM)

## 2010-11-30 NOTE — Assessment & Plan Note (Signed)
Summary: F/U FOOT FRACTURE PER DR Maricela Schreur/NT   Vital Signs:  Patient profile:   70 year old male Height:      68 inches Weight:      190.0 pounds BMI:     28.99 Temp:     98.2 degrees F oral Pulse rate:   72 / minute Pulse rhythm:   regular BP sitting:   102 / 70  (left arm) Cuff size:   regular  Vitals Entered By: Benny Lennert CMA Duncan Dull) (Mar 01, 2010 12:10 PM)  History of Present Illness: Chief complaint follow  up foot fracture  f/u fracture and DM:  R toe fracture is doing well, cellulitis is better. Minimal pain at this point at fracture site  DM: doing much better, BS never higher than 140 at this point. BMP, a1c  R shoulder: painful, XR reviewed with them. Had dramatic loss of motion. Has been doing some home ROM with aid. Improved ROM since doing this.  Irritability: much more irritable with family and aid. Fighting and yelling a lot.   Nasal lesion: R, will repetitively bleed. H/o skin CA in the past.  Allergies: 1)  Lipitor (Atorvastatin Calcium)  Past History:  Past medical, surgical, family and social histories (including risk factors) reviewed, and no changes noted (except as noted below).  Past Medical History: Reviewed history from 09/21/2009 and no changes required. 1. Prior back surgery. 2. Type 2 diabetes with poor compliance 3. Hypertension. 4. Coronary artery disease status post MI in 2003 followed by coronary artery bypass grafting in January 2003.  The patient has a LIMA to the LAD, sequential saphenous vein graft to the second and third obtuse marginals, saphenous vein graft to the first diagonal, and a sequential saphenous vein graft to the PDA and an acute marginal.  Ad myoview 6/10 showed EF 39%, inferior/septal/inferolateral hypokinesis and scar, no ischemia. 5. CHF:   Echo (7/10) was technically difficult.  EF was depressed but hard to tell how severe.  Possible mild aortic stenosis.  Adenosine myoview (6/10) showed EF 39%.  6. History of  falls. 7. Hyperlipidemia. 8. 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  9. Low back pain. 10.Hip pain. 11.GERD 12.Prior smoker. 13.History of syncope. 14.Nephrolithiasis. 15. 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 INSOMNIA NOS (ICD-780.52) 20. ANEMIA, IRON DEFICIENCY, UNSPEC. (ICD-280.9) 21. Aortic stenosis: mild.   Past Surgical History: Reviewed history from 05/27/2009 and no changes required. 3v CABG - 10/31/2001 4 Spine surgeries, pt. unclear what occurred 1980s - 2000's cath - ef 35%, 3v dz - 10/31/2001 echo - ef 30%, mod RA/LA/LV enlargemt, mod MR/TR/AI - 05/01/2003 EYE IMPLANTS? - L4-5 extraforaminal microdiscectomy - 07/02/2003  MRI brain - chronic sm v ischemic changes & diffuse atrophy - 12/29/2001 MRI spine - lg R paracentral L4-5 disc protrusion, severe narrowing of neural foramen - 04/30/2002 R hip - mild degenerative changes - 02/28/2002 spinal facet blocks 12/03, 1/04, 3/04 - 12/30/2002 umb hernia repair - 07/31/2002 MRI brain, 04/2009, no infarct HOSP: 04/2009: found down, no CVA, ? normal pressure hydrocephalus  Family History: Reviewed history from 12/28/2006 and no changes required. 3 OTHER SISTERS ARE HEALTHY, DAD DIED OF EMPHYSEMA - AGE 73`S, MOM DIED OF HEART DZ, DM - AGE 67, SISTER DIED OF EMPHYSEMA - AGE 67  Social History: Reviewed history from 08/19/2009 and no changes required. SEPARATED FROM WIFE, 2 SONS, 2 DTR'S.  DISABLED PLUMBER SINCE 2002.   LIV[place] IN [place] W/ CATS, COWS, TURKEYS, GOATS, DONKEYS.  QUIT SMOKING 1990, FORMERLY 4-5PPD X 51YRS   NO ETOH. Ex-wife, [place] Citizen, in and out of the picture CANNOT READ AND WRITE  Review of Systems General:  Denies chills and fever; walking better. MS:  See HPI. Psych:  Complains of irritability.  Physical Exam  General:  alert, well-developed, and well-nourished.  Walks in with walker. Head:  Normocephalic and atraumatic without obvious abnormalities. No apparent  alopecia or balding. Ears:  no external deformities.   Nose:  no external deformity.   Lungs:  Normal respiratory effort, chest expands symmetrically. Lungs are clear to auscultation, no crackles or wheezes. Heart:  Normal rate and regular rhythm. III/VI SEM. S1 and S2 normal without gallop, click, rub or other extra sounds. Msk:  R foot, minimally TTP at distal 4 and 5 MT and toes  R Shoulder: Loss of approx 15 deg at abd and flexion, mild loss of IROM at R compared to L Str preserved minimal impingement Extremities:  no edema Cervical Nodes:  No lymphadenopathy noted Psych:  Cognition and judgment appear intact. Alert and cooperative with normal attention span and concentration. No apparent delusions, illusions, hallucinations   Impression & Recommendations:  Problem # 1:  FRACTURE, TOE, RIGHT (ICD-826.0) Assessment Improved  Problem # 2:  CELLULITIS, FOOT (ICD-682.7) Assessment: Improved  The following medications were removed from the medication list:    Cephalexin 500 Mg Caps (Cephalexin) .Marland Kitchen... 1 by mouth 4 times daily    Levaquin 500 Mg Tabs (Levofloxacin) .Marland Kitchen... 1 by mouth daily  Problem # 3:  SHOULDER PAIN, BILATERAL (ICD-719.41) Assessment: New  c/w R shoulder improving frozen shoulder  Intrarticular Shoulder Injection, R Verbal consent was obtained from the patient. Risks, benefits, and alternatives were explained. Patient prepped with Betadine and Ethyl Chloride used for anesthesia. An intraarticular shoulder injection was performed using the posterior approach. The patient tolerated the procedure well and had decreased pain post injection. No complications. Injection: 9 cc of Marcaine 0.5% and 1cc of Kenalog 40 mg. Needle: 22 gauge   Orders: Joint Aspirate / Injection, Large (20610) Kenalog 10mg  (4units) (J3301)  Problem # 4:  CARCINOMA, BASAL CELL (ICD-173.9) Assessment: New R nose  - ? basal cell CA, derm referal  Problem # 5:  DIABETES MELLITUS II,  UNCOMPLICATED (ICD-250.00) Assessment: Improved  His updated medication list for this problem includes:    Benazepril Hcl 10 Mg Tabs (Benazepril hcl) .Marland Kitchen... 1 tab by mouth daily    Metformin Hcl 1000 Mg Tabs (Metformin hcl) .Marland Kitchen... 1 by mouth two times a day    Actos 45 Mg Tabs (Pioglitazone hcl) .Marland Kitchen... 1 by mouth daily    Onglyza 5 Mg Tabs (Saxagliptin hcl) .Marland Kitchen... 1 by mouth daily    Glipizide 5 Mg Xr24h-tab (Glipizide) .Marland Kitchen... 1 by mouth daily  Orders: Venipuncture ([address]) TLB-BMP (Basic Metabolic Panel-BMET) (80048-METABOL) TLB-A1C / Hgb A1C (Glycohemoglobin) (83036-A1C)  Problem # 6:  IRRITABILITY (UEA-540.98) Assessment: New  trial of remeron  Orders: Prescription Created Electronically [phone])  Complete Medication List: 1)  Omeprazole 40 Mg Cpdr (Omeprazole) .... Take 1 tablet by mouth once a day 2)  Crestor 20 Mg Tabs (Rosuvastatin calcium) .Marland Kitchen.. 1 by mouth at bedtime 3)  Oxybutynin Chloride 10 Mg Xr24h-tab (Oxybutynin chloride) .Marland Kitchen.. 1 by mouth daily 4)  Carvedilol 6.25 Mg Tabs (Carvedilol) .... Take one tablet by mouth twice a day 5)  Carbidopa-levodopa 25-100 Mg Tabs (Carbidopa-levodopa) .... 2 by mouth three times a day 6)  Plavix 75 Mg Tabs (Clopidogrel bisulfate) .... Take one a  day 7)  Furosemide 40 Mg Tabs (Furosemide) .... Take one tablet by mouth daily. 8)  Benazepril Hcl 10 Mg Tabs (Benazepril hcl) .Marland Kitchen.. 1 tab by mouth daily 9)  Metformin Hcl 1000 Mg Tabs (Metformin hcl) .Marland Kitchen.. 1 by mouth two times a day 10)  Actos 45 Mg Tabs (Pioglitazone hcl) .Marland Kitchen.. 1 by mouth daily 11)  Lidoderm 5 % Ptch (Lidocaine) .... Apply for 12 hours to affected area, then keep off for 12 hours 12)  Onglyza 5 Mg Tabs (Saxagliptin hcl) .Marland Kitchen.. 1 by mouth daily 13)  Glipizide 5 Mg Xr24h-tab (Glipizide) .Marland Kitchen.. 1 by mouth daily 14)  Mirtazapine 15 Mg Tabs (Mirtazapine) .Marland Kitchen.. 1 by mouth daily  Other Orders: Dermatology Referral (Derma)  Patient Instructions: 1)  f/u 3 months 2)  HgBA1c prior to visit   ICD-9: 250.00 3)  Urine Microalbumin prior to visit ICD-9 : 250.00 4)  Referral Appointment Information 5)  Day/Date: 6)  Time: 7)  Place/MD: 8)  Address: 9)  Phone/Fax: 10)  Patient given appointment information. Information/Orders faxed/mailed.  Prescriptions: MIRTAZAPINE 15 MG TABS (MIRTAZAPINE) 1 by mouth daily  #30 x 5   Entered and Authorized by:   Hannah Beat MD   Signed by:   Hannah Beat MD on 03/01/2010   Method used:   [facility]        [facility] (retail)       [address]       [place], [place]  [address]       Ph: [phone] or [phone]       Fax: [phone]   RxID:   [id number]   Current Allergies (reviewed today): LIPITOR (ATORVASTATIN CALCIUM)

## 2010-11-30 NOTE — Miscellaneous (Signed)
Summary: med list update, cipro  Clinical Lists Changes  Medications: Added new medication of CIPRO 250 MG TABS (CIPROFLOXACIN HCL) take one by mouth two times a day x 10 days     Prior Medications: OMEPRAZOLE 40 MG CPDR (OMEPRAZOLE) Take 1 tablet by mouth once a day CRESTOR 20 MG TABS (ROSUVASTATIN CALCIUM) 1 by mouth at bedtime OXYBUTYNIN CHLORIDE 10 MG XR24H-TAB (OXYBUTYNIN CHLORIDE) 1 by mouth daily CARVEDILOL 6.25 MG TABS (CARVEDILOL) Take one tablet by mouth twice a day CARBIDOPA-LEVODOPA 25-100 MG TABS (CARBIDOPA-LEVODOPA) 2 by mouth three times a day PLAVIX 75 MG TABS (CLOPIDOGREL BISULFATE) take one a day FUROSEMIDE 40 MG TABS (FUROSEMIDE) Take one tablet by mouth daily. BENAZEPRIL HCL 10 MG TABS (BENAZEPRIL HCL) 1 tab by mouth daily METFORMIN HCL 1000 MG TABS (METFORMIN HCL) 1 by mouth two times a day ACTOS 45 MG TABS (PIOGLITAZONE HCL) 1 by mouth daily LIDODERM 5 % PTCH (LIDOCAINE) Apply for 12 hours to affected area, then keep off for 12 hours ONGLYZA 5 MG TABS (SAXAGLIPTIN HCL) 1 by mouth daily GLIPIZIDE 5 MG XR24H-TAB (GLIPIZIDE) 1 by mouth daily UNDERPADS FOR INCONTINENCE () 788.30, dx. urinary incontinence SPIRIVA HANDIHALER 18 MCG  CAPS (TIOTROPIUM BROMIDE MONOHYDRATE) contents of one capsule inhaled daily PROAIR HFA 108 (90 BASE) MCG/ACT  AERS (ALBUTEROL SULFATE) 2 inh q4h as needed shortness of breath TAMSULOSIN HCL 0.4 MG CAPS (TAMSULOSIN HCL) 1 by mouth daily Current Allergies: LIPITOR (ATORVASTATIN CALCIUM)

## 2010-11-30 NOTE — Progress Notes (Signed)
Summary: feet are swollen  Phone Note Call from Patient Call back at Home Phone [phone]   Caller: care giver Nicki Summary of Call: Care giver reports that both of pt's feet are badly swollen.  He is taking 40 mg's of lasix daily.  The swelling goes up to his calves.  Not keeping them elevated like he should because he forgets.  Please advise on what to do. Initial call taken by: Lowella Petties CMA,  May 26, 2010 2:01 PM  Follow-up for Phone Call        i have not seen him in a few months  elevating feet is a good idea -- he has a lot of medical problems, we should see him and make sure heart or kidneys have not worsened. Thurs or Fri ok, 30 mins appt  Follow-up by: Hannah Beat MD,  May 26, 2010 2:05 PM  Additional Follow-up for Phone Call Additional follow up Details #1::        Advised care giver, appt made. Additional Follow-up by: Lowella Petties CMA,  May 26, 2010 2:24 PM

## 2010-11-30 NOTE — Assessment & Plan Note (Signed)
Summary: 3 WEEK FOLLOW-UP/JRR   Vital Signs:  Patient profile:   70 year old male Height:      68 inches Weight:      192.2 pounds BMI:     29.33 Temp:     97.7 degrees F oral Pulse rate:   76 / minute Pulse rhythm:   regular BP sitting:   122 / 70  (left arm) Cuff size:   regular  Vitals Entered By: Benny Lennert CMA Duncan Dull) (January 04, 2010 10:31 AM)  History of Present Illness: Chief complaint 30 minute 3 wk follow up  On metformin 1000 mg by mouth two times a day and onglyza recent d/c insulin, blood sugars are doing better. While he is taking his medicines blood sugars range from 180-190. This weekend, he did not receive his medications and his blood sugars increased to approximately 300.  Confusion: much bettercomment patient's daughter did discuss this with his neurologist on the telephone as well. Now is 100% better.  BS better  Right shoulder hurting a lot, catches a lot. crepitus with motion. He has had multiple falls, however no known occult fracture injury.  Allergies: 1)  Lipitor (Atorvastatin Calcium)  Past History:  Past medical, surgical, family and social histories (including risk factors) reviewed, and no changes noted (except as noted below).  Past Medical History: Reviewed history from 09/21/2009 and no changes required. 1. Prior back surgery. 2. Type 2 diabetes with poor compliance 3. Hypertension. 4. Coronary artery disease status post MI in 2003 followed by coronary artery bypass grafting in January 2003.  The patient has a LIMA to the LAD, sequential saphenous vein graft to the second and third obtuse marginals, saphenous vein graft to the first diagonal, and a sequential saphenous vein graft to the PDA and an acute marginal.  Ad myoview 6/10 showed EF 39%, inferior/septal/inferolateral hypokinesis and scar, no ischemia. 5. CHF:   Echo (7/10) was technically difficult.  EF was depressed but hard to tell how severe.  Possible mild aortic stenosis.   Adenosine myoview (6/10) showed EF 39%.  6. History of falls. 7. Hyperlipidemia. 8. 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  9. Low back pain. 10.Hip pain. 11.GERD 12.Prior smoker. 13.History of syncope. 14.Nephrolithiasis. 15. 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 INSOMNIA NOS (ICD-780.52) 20. ANEMIA, IRON DEFICIENCY, UNSPEC. (ICD-280.9) 21. Aortic stenosis: mild.   Past Surgical History: Reviewed history from 05/27/2009 and no changes required. 3v CABG - 10/31/2001 4 Spine surgeries, pt. unclear what occurred 1980s - 2000's cath - ef 35%, 3v dz - 10/31/2001 echo - ef 30%, mod RA/LA/LV enlargemt, mod MR/TR/AI - 05/01/2003 EYE IMPLANTS? - L4-5 extraforaminal microdiscectomy - 07/02/2003  MRI brain - chronic sm v ischemic changes & diffuse atrophy - 12/29/2001 MRI spine - lg R paracentral L4-5 disc protrusion, severe narrowing of neural foramen - 04/30/2002 R hip - mild degenerative changes - 02/28/2002 spinal facet blocks 12/03, 1/04, 3/04 - 12/30/2002 umb hernia repair - 07/31/2002 MRI brain, 04/2009, no infarct HOSP: 04/2009: found down, no CVA, ? normal pressure hydrocephalus  Family History: Reviewed history from 12/28/2006 and no changes required. 3 OTHER SISTERS ARE HEALTHY, DAD DIED OF EMPHYSEMA - AGE 49`S, MOM DIED OF HEART DZ, DM - AGE 75, SISTER DIED OF EMPHYSEMA - AGE 40  Social History: Reviewed history from 08/19/2009 and no changes required. SEPARATED FROM WIFE, 2 SONS, 2 DTR'S.  DISABLED PLUMBER SINCE 2002.   LIVES IN [place] W/ CATS, COWS, TURKEYS, GOATS, DONKEYS.  QUIT SMOKING 1990, FORMERLY 4-5PPD X 80YRS   NO ETOH. Ex-wife, [place] Citizen, in and out of the picture CANNOT READ AND WRITE  Review of Systems       no fever, chills, sweats. Complaints of shoulder pain, other diffuse musca skeletal pains. No chest pain.  Physical Exam  General:  NAD in wheelchair well-developed, well-nourished, well-hydrated, normal  appearance, and cooperative to examination.   Head:  normocephalic, atraumatic, no abnormalities observed, and no abnormalities palpated.   Ears:  no external deformities.   Nose:  no external erythema.   Mouth:  pharynx pink and moist, no erythema, no exudates, and no posterior lymphoid hypertrophy.   Neck:  No deformities, masses, or tenderness noted. Lungs:  Normal respiratory effort, chest expands symmetrically. Lungs are clear to auscultation, no crackles or wheezes. Heart:  Normal rate and regular rhythm. III/VI SEM. S1 and S2 normal without gallop, click, rub or other extra sounds. Extremities:  no edema Cervical Nodes:  No lymphadenopathy noted Psych:  Cognition and judgment appear intact. Alert and cooperative with normal attention span and concentration. No apparent delusions, illusions, hallucinations   Impression & Recommendations:  Problem # 1:  DIABETES MELLITUS II, UNCOMPLICATED (ICD-250.00) Assessment Improved the patient is doing better, Actos  The following medications were removed from the medication list:    Onglyza 5 Mg Tabs (Saxagliptin hcl) .Marland Kitchen... 1 by mouth daily His updated medication list for this problem includes:    Benazepril Hcl 10 Mg Tabs (Benazepril hcl) .Marland Kitchen... 1 tab by mouth daily    Metformin Hcl 1000 Mg Tabs (Metformin hcl) .Marland Kitchen... 1 by mouth two times a day    Actos 30 Mg Tabs (Pioglitazone hcl) .Marland Kitchen... 1 by mouth daily    Onglyza 5 Mg Tabs (Saxagliptin hcl) .Marland Kitchen... 1 by mouth daily  Problem # 2:  CONFUSION (ICD-298.9) Assessment: Improved improved, actual origin is not clear, may be relating to his diabetes it was not well controlled. 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 Take one tablet by mouth twice a day    Furosemide 40 Mg Tabs (Furosemide)  .Marland Kitchen... Take one tablet by mouth daily.    Benazepril Hcl 10 Mg Tabs (Benazepril hcl) .Marland Kitchen... 1 tab by mouth daily  Problem # 4:  CONGESTIVE HEART FAILURE, UNSPECIFIED (ICD-428.0) on appropriate medical regimen, I made the decision that the patient given his educational background, and inability to dose his insulin, would be better off if possible on oral medications as above. This could impact his heart failure, and he needs to be maximized from a medical standpoint for his heart failure treatment. this is a difficult case with no correct, perfect answer.  His updated medication list for this problem includes:    Carvedilol 6.25 Mg Tabs (Carvedilol) .Marland Kitchen... Take one tablet by mouth twice a day    Plavix 75 Mg Tabs (Clopidogrel bisulfate) .Marland Kitchen... Take one a day    Furosemide 40 Mg Tabs (Furosemide) .Marland Kitchen... Take one tablet by mouth daily.    Benazepril Hcl 10 Mg Tabs (Benazepril hcl) .Marland Kitchen... 1 tab by mouth daily  Complete Medication List: 1)  Omeprazole 20 Mg Cpdr (Omeprazole) .... Take 1 capsule by mouth once a day 2)  Crestor 20 Mg Tabs (Rosuvastatin calcium) .Marland Kitchen.. 1 by mouth at bedtime 3)  Oxybutynin Chloride 10 Mg Xr24h-tab (  Oxybutynin chloride) .Marland Kitchen.. 1 by mouth daily 4)  Carvedilol 6.25 Mg Tabs (Carvedilol) .... Take one tablet by mouth twice a day 5)  Carbidopa-levodopa 25-100 Mg Tabs (Carbidopa-levodopa) .Marland Kitchen.. 1 by mouth three times a day 6)  Plavix 75 Mg Tabs (Clopidogrel bisulfate) .... Take one a day 7)  Furosemide 40 Mg Tabs (Furosemide) .... Take one tablet by mouth daily. 8)  Benazepril Hcl 10 Mg Tabs (Benazepril hcl) .Marland Kitchen.. 1 tab by mouth daily 9)  Metformin Hcl 1000 Mg Tabs (Metformin hcl) .Marland Kitchen.. 1 by mouth two times a day 10)  Power Mobility Device  .... Date of face to face exam: 09/21/2009 length of need: 99 (lifetime) weight=201 pounds  icd-9 codes: 428.0, 250.60, 719.45, 331.3, v12.50, 414.0 11)  Actos 30 Mg Tabs (Pioglitazone hcl) .Marland Kitchen.. 1 by mouth daily 12)  Lidoderm 5 % Ptch (Lidocaine) ....  Apply for 12 hours to affected area, then keep off for 12 hours 13)  Onglyza 5 Mg Tabs (Saxagliptin hcl) .Marland Kitchen.. 1 by mouth daily  Patient Instructions: 1)  BMP prior to visit, ICD-9: 250.00 2)  HgBA1c prior to visit  ICD-9: 250.00 3)  follow-up 2 months Prescriptions: LIDODERM 5 % PTCH (LIDOCAINE) Apply for 12 hours to affected area, then keep off for 12 hours  #30 x 5   Entered and Authorized by:   Hannah Beat MD   Signed by:   Hannah Beat MD on 01/04/2010   Method used:   [facility]        [id number]       [address]       [place], [place]  [address]       Ph: [phone] or [phone]       Fax: [phone]   RxID:   [id number] OXYBUTYNIN CHLORIDE 10 MG XR24H-TAB (OXYBUTYNIN CHLORIDE) 1 by mouth daily  #30 x 11   Entered and Authorized by:   Hannah Beat MD   Signed by:   Hannah Beat MD on 01/04/2010   Method used:   [facility]        [facility] (retail)       [address]       [place], [place]  [address]       Ph: [phone] or [phone]       Fax: [phone]   RxID:   [id number] ACTOS 30 MG TABS (PIOGLITAZONE HCL) 1 by mouth daily  #30 x 11   Entered and Authorized by:   Hannah Beat MD   Signed by:   Hannah Beat MD on 01/04/2010   Method used:   [facility]        [facility] (retail)       [address]       [place], [place]  [address]       Ph: [phone] or [phone]       Fax: [phone]   RxID:   [id number]   Current Allergies (reviewed today): LIPITOR (ATORVASTATIN CALCIUM)

## 2010-12-01 ENCOUNTER — Encounter: Payer: Self-pay | Admitting: Family Medicine

## 2010-12-02 NOTE — Assessment & Plan Note (Signed)
Summary: FOLLOW UP / LFW   Vital Signs:  Patient profile:   70 year old male Height:      68 inches Weight:      199 pounds BMI:     30.37 Temp:     98.4 degrees F oral Pulse rate:   72 / minute Pulse rhythm:   regular BP sitting:   100 / 60  (left arm) Cuff size:   regular  Vitals Entered By: Benny Lennert CMA Duncan Dull) (November 24, 2010 11:49 AM)  History of Present Illness: Chief complaint 3 month follow Blood pressure,diabetes  BP: stable and doing well, compliant with all medications  DM: compliant, BS low 100's, rare 200 with poor eating.   CAD, CHF: compliant now with Plavix, b-blocker, ace inhibitor, taking some lasix. No dyspnea.  Dementia: worsening slightly per daughter. Has had some problems with solicitors -- had a security system installed that was sold to him, and he did not understand what it mean or the costs involved.   Parkinsonism: on low dose sinemet, and tolerating this better. Tremor is visibly much improved.  B legs are hit - l leg reddish around strike zone. few healing scabs.   Allergies: 1)  Lipitor (Atorvastatin Calcium)  Past History:  Past medical, surgical, family and social histories (including risk factors) reviewed, and no changes noted (except as noted below).  Past Medical History: medical noncompliance. 1. Prior back surgery. 2. Type 2 diabetes with poor compliance 3. Hypertension. 4. Coronary artery disease status post MI in 2003 followed by coronary artery bypass grafting in January 2003.  The patient has a LIMA to the LAD, sequential saphenous vein graft to the second and third obtuse marginals, saphenous vein graft to the first diagonal, and a sequential saphenous vein graft to the PDA and an acute marginal.  Ad myoview 6/10 showed EF 39%, inferior/septal/inferolateral hypokinesis and scar, no ischemia. 5. CHF:   Echo (7/10) was technically difficult.  EF was depressed but hard to tell how severe.  Possible mild aortic  stenosis.  Adenosine myoview (6/10) showed EF 39%.  6. History of falls. 7. Hyperlipidemia. 8. 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  9. Low back pain. 10.Hip pain. 11.GERD 12.Prior smoker. 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 15. 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 INSOMNIA NOS (ICD-780.52) 20. ANEMIA, IRON DEFICIENCY, UNSPEC. (ICD-280.9) 21. Aortic stenosis: mild.  22. COPD  Past Surgical History: 3v CABG - 10/31/2001 4 Spine surgeries, pt. unclear what occurred 1980s - 2000's cath - ef 35%, 3v dz - 10/31/2001 echo - ef 30%, mod RA/LA/LV enlargemt, mod MR/TR/AI - 05/01/2003 EYE IMPLANTS? - L4-5 extraforaminal microdiscectomy - 07/02/2003  spinal facet blocks 12/03, 1/04, 3/04 - 12/30/2002  Family History: Reviewed history from 12/28/2006 and no changes required. 3 OTHER SISTERS ARE HEALTHY, DAD DIED OF EMPHYSEMA - AGE 58`S, MOM DIED OF HEART DZ, DM - AGE 74, SISTER DIED OF EMPHYSEMA - AGE 55  Social History: Reviewed history from 08/19/2009 and no changes required. SEPARATED FROM WIFE, 2 SONS, 2 DTR'S.  DISABLED PLUMBER SINCE 2002.   LIVES IN [place] W/ CATS, COWS, TURKEYS, GOATS, DONKEYS.   QUIT SMOKING 1990, FORMERLY 4-5PPD X 71YRS   NO ETOH. Ex-wife, [place] Citizen, in and out of the picture CANNOT READ AND WRITE  Review of Systems       REVIEW OF SYSTEMS GEN: No acute illnesses, no fever, chills, sweats. CV: No chest pain or SOB GI: No noted N or V not getting up  out of his chair frequently - only with help. Otherwise, pertinent positives and negatives are noted in the HPI.   Physical Exam  General:  alert, well-developed, and well-nourished.  Head:  Normocephalic and atraumatic without obvious abnormalities. No apparent alopecia or balding. Ears:  no external deformities.   Nose:  no external deformity.   Lungs:  Normal respiratory effort, chest expands symmetrically. Lungs are clear to auscultation, no  crackles or wheezes. Heart:  Normal rate and regular rhythm. III/VI SEM. S1 and S2 normal without gallop, click, rub or other extra sounds. Extremities:  tr edema, LE few healing scabs, anterior shins Neurologic:  visibly less tremor in wheelchair Psych:  Cognition and judgment appear intact. Alert and cooperative with normal attention span and concentration. No apparent delusions, illusions, hallucinations   Impression & Recommendations:  Problem # 1:  DIABETES MELLITUS II, UNCOMPLICATED (ICD-250.00) Assessment Improved  His updated medication list for this problem includes:    Benazepril Hcl 10 Mg Tabs (Benazepril hcl) .Marland Kitchen... 1 tab by mouth daily    Metformin Hcl 1000 Mg Tabs (Metformin hcl) .Marland Kitchen... 1 by mouth two times a day    Actos 45 Mg Tabs (Pioglitazone hcl) .Marland Kitchen... 1 by mouth daily    Onglyza 5 Mg Tabs (Saxagliptin hcl) .Marland Kitchen... 1 by mouth daily    Glipizide 5 Mg Xr24h-tab (Glipizide) .Marland Kitchen... 1 by mouth daily  Orders: TLB-A1C / Hgb A1C (Glycohemoglobin) (83036-A1C)  Labs Reviewed: Creat: 0.9 (08/23/2010)   Microalbumin: trace (01/25/2007) Reviewed HgBA1c results: 6.6 (08/23/2010)  6.2 (05/27/2010)  Problem # 2:  PARKINSONISM (ICD-332.0) Assessment: Improved doing well on sinemet, low dose  Problem # 3:  COPD (ICD-496) Assessment: Improved compliant  His updated medication list for this problem includes:    Spiriva Handihaler 18 Mcg Caps (Tiotropium bromide monohydrate) .Marland Kitchen... Contents of one capsule inhaled daily    Proair Hfa 108 (90 Base) Mcg/act Aers (Albuterol sulfate) .Marland Kitchen... 2 inh q4h as needed shortness of breath  Problem # 4:  CONGESTIVE HEART FAILURE, UNSPECIFIED (ICD-428.0) much better compliance  His updated medication list for this problem includes:    Carvedilol 6.25 Mg Tabs (Carvedilol) .Marland Kitchen... Take one tablet by mouth twice a day    Plavix 75 Mg Tabs (Clopidogrel bisulfate) .Marland Kitchen... Take one a day    Furosemide 40 Mg Tabs (Furosemide) .Marland Kitchen... Take one tablet by  mouth daily.    Benazepril Hcl 10 Mg Tabs (Benazepril hcl) .Marland Kitchen... 1 tab by mouth daily  Problem # 5:  CEREBROVASCULAR ACCIDENT, HX OF (ICD-V12.50) dementia, multifactorial, worsening minimally as above, story with difficulty with some financial transactions.  Complete Medication List: 1)  Crestor 20 Mg Tabs (Rosuvastatin calcium) .Marland Kitchen.. 1 by mouth at bedtime 2)  Carvedilol 6.25 Mg Tabs (Carvedilol) .... Take one tablet by mouth twice a day 3)  Carbidopa-levodopa 25-100 Mg Tabs (Carbidopa-levodopa) .Marland Kitchen.. 1 tablet in am 1 tablet midday and 1/2 tablet in the evening 4)  Plavix 75 Mg Tabs (Clopidogrel bisulfate) .... Take one a day 5)  Furosemide 40 Mg Tabs (Furosemide) .... Take one tablet by mouth daily. 6)  Benazepril Hcl 10 Mg Tabs (Benazepril hcl) .Marland Kitchen.. 1 tab by mouth daily 7)  Metformin Hcl 1000 Mg Tabs (Metformin hcl) .Marland Kitchen.. 1 by mouth two times a day 8)  Actos 45 Mg Tabs (Pioglitazone hcl) .Marland Kitchen.. 1 by mouth daily 9)  Lidoderm 5 % Ptch (Lidocaine) .... Apply for 12 hours to affected area, then keep off for 12 hours 10)  Onglyza 5 Mg Tabs (Saxagliptin hcl) .Marland KitchenMarland KitchenMarland Kitchen  1 by mouth daily 11)  Glipizide 5 Mg Xr24h-tab (Glipizide) .Marland Kitchen.. 1 by mouth daily 12)  Underpads For Incontinence  .Marland Kitchen.. 788.30, dx. urinary incontinence 13)  Spiriva Handihaler 18 Mcg Caps (Tiotropium bromide monohydrate) .... Contents of one capsule inhaled daily 14)  Proair Hfa 108 (90 Base) Mcg/act Aers (Albuterol sulfate) .... 2 inh q4h as needed shortness of breath 15)  Tamsulosin Hcl 0.4 Mg Caps (Tamsulosin hcl) .Marland Kitchen.. 1 by mouth daily 16)  Protonix 40 Mg Tbec (Pantoprazole sodium) .... Take one tablet daily  Other Orders: Venipuncture (16109) TLB-BMP (Basic Metabolic Panel-BMET) (80048-METABOL) TLB-CBC Platelet - w/Differential (85025-CBCD) TLB-Hepatic/Liver Function Pnl (80076-HEPATIC) TLB-Cholesterol, Direct LDL (83721-DIRLDL)   Orders Added: 1)  Venipuncture [36415] 2)  TLB-BMP (Basic Metabolic Panel-BMET)  [80048-METABOL] 3)  TLB-CBC Platelet - w/Differential [85025-CBCD] 4)  TLB-Hepatic/Liver Function Pnl [80076-HEPATIC] 5)  TLB-Cholesterol, Direct LDL [83721-DIRLDL] 6)  TLB-A1C / Hgb A1C (Glycohemoglobin) [83036-A1C] 7)  Est. Patient Level IV [60454]    Current Allergies (reviewed today): LIPITOR (ATORVASTATIN CALCIUM)

## 2010-12-02 NOTE — Progress Notes (Signed)
Summary: recertified for CAP  Phone Note Call from Patient   Caller: Darl Pikes with CAP in [facility] [phone] Summary of Call: FYI- pt is being recertified for CAP, good for one year. Initial call taken by: Lowella Petties CMA, AAMA,  November 12, 2010 3:20 PM

## 2010-12-02 NOTE — Miscellaneous (Signed)
Summary: FL-2/[employer] [facility] [employer]  EA-5/[id number] [employer]   Imported By: Lester [place] 10/22/2010 10:19:34  _____________________________________________________________________  External Attachment:    Type:   Image     Comment:   External Document

## 2010-12-02 NOTE — Letter (Signed)
Summary: Generic Letter  [place] at [facility]  [address] [place], [place] [address]   Phone: [phone]  Fax: [phone]    11/24/2010  [name] Newman [address] [place], [place]  [address]  TO WHOM IT MAY CONCERN:   Mr. [name] Newman has dementia and Parksinsonian symptoms, so that the security system sold to him could not have been cognitively understood by this patient. His money should be refunded, as there is no way that this patient could understand the ramifications of this business transaction or the contract for this system.   If there is any question, I am happy to provide further details about [name] Newman.       Sincerely,   Hannah Beat MD

## 2010-12-08 ENCOUNTER — Encounter: Payer: Self-pay | Admitting: Family Medicine

## 2010-12-22 NOTE — Miscellaneous (Signed)
Summary: Physician's Orders/[facility]  Physician's Orders/[facility]   Imported By: Maryln Gottron 12/14/2010 15:14:53  _____________________________________________________________________  External Attachment:    Type:   Image     Comment:   External Document

## 2010-12-22 NOTE — Letter (Signed)
Summary: Dept. of Health Approval Letter for CAP/DA  Dept. of Health Approval Letter for CAP/DA   Imported By: Kassie Mends 12/14/2010 08:44:56  _____________________________________________________________________  External Attachment:    Type:   Image     Comment:   External Document

## 2011-01-05 ENCOUNTER — Telehealth: Payer: Self-pay | Admitting: Family Medicine

## 2011-01-11 NOTE — Progress Notes (Signed)
Summary: lidoderm patch  Phone Note Refill Request Message from:  [employer] on January 05, 2011 2:44 PM  Refills Requested: Medication #1:  LIDODERM 5 % PTCH Apply for 12 hours to affected area   Supply Requested: [facility] months [facility]   Method Requested: Telephone to Pharmacy Initial call taken by: Benny Lennert CMA Duncan Dull),  January 05, 2011 2:44 PM    Prescriptions: LIDODERM 5 % PTCH (LIDOCAINE) Apply for 12 hours to affected area, then keep off for 12 hours  #90 x 3   Entered and Authorized by:   Hannah Beat MD   Signed by:   Hannah Beat MD on 01/05/2011   Method used:   [facility]ronically to        [facility]  [place] (retail)       [address]       [place], [place]  [address]       Ph: [phone] or [phone]       Fax: [phone]   RxID:   [id number]

## 2011-01-12 LAB — CBC
MCV: 93.7 fL (ref 78.0–100.0)
Platelets: 123 10*3/uL — ABNORMAL LOW (ref 150–400)
RDW: 13.3 % (ref 11.5–15.5)
WBC: 6.2 10*3/uL (ref 4.0–10.5)

## 2011-01-12 LAB — GLUCOSE, CAPILLARY
Glucose-Capillary: 107 mg/dL — ABNORMAL HIGH (ref 70–99)
Glucose-Capillary: 91 mg/dL (ref 70–99)

## 2011-01-12 LAB — BASIC METABOLIC PANEL
BUN: 7 mg/dL (ref 6–23)
Calcium: 9.2 mg/dL (ref 8.4–10.5)
Chloride: 104 mEq/L (ref 96–112)
Creatinine, Ser: 0.91 mg/dL (ref 0.4–1.5)
GFR calc Af Amer: >60 mL/min (ref >60)
GFR calc non Af Amer: >60 mL/min (ref >60)

## 2011-01-13 LAB — URINE CULTURE

## 2011-01-13 LAB — CARDIAC PANEL(CRET KIN+CKTOT+MB+TROPI)
CK, MB: 1.1 ng/mL (ref 0.3–4.0)
CK, MB: 1.2 ng/mL (ref 0.3–4.0)
Relative Index: 1 (ref 0.0–2.5)
Total CK: 121 U/L (ref 7–232)

## 2011-01-13 LAB — CBC
HCT: 31.6 % — ABNORMAL LOW (ref 39.0–52.0)
Hemoglobin: 10.5 g/dL — ABNORMAL LOW (ref 13.0–17.0)
Hemoglobin: 12.9 g/dL — ABNORMAL LOW (ref 13.0–17.0)
MCH: 31.9 pg (ref 26.0–34.0)
MCH: 31.7 pg (ref 26.0–34.0)
MCHC: 33.2 g/dL (ref 30.0–36.0)
MCHC: 33.3 g/dL (ref 30.0–36.0)
MCV: 95.1 fL (ref 78.0–100.0)
MCV: 95.8 fL (ref 78.0–100.0)
Platelets: 144 10*3/uL — ABNORMAL LOW (ref 150–400)
Platelets: 104 10*3/uL — ABNORMAL LOW (ref 150–400)
Platelets: 111 10*3/uL — ABNORMAL LOW (ref 150–400)
RBC: 3.34 MIL/uL — ABNORMAL LOW (ref 4.22–5.81)
RBC: 3.57 MIL/uL — ABNORMAL LOW (ref 4.22–5.81)
RDW: 13.3 % (ref 11.5–15.5)
RDW: 13.8 % (ref 11.5–15.5)
RDW: 13.9 % (ref 11.5–15.5)
WBC: 11.1 10*3/uL — ABNORMAL HIGH (ref 4.0–10.5)
WBC: 15.2 10*3/uL — ABNORMAL HIGH (ref 4.0–10.5)
WBC: 16 10*3/uL — ABNORMAL HIGH (ref 4.0–10.5)

## 2011-01-13 LAB — PROTIME-INR
INR: 1.02 (ref 0.00–1.49)
Prothrombin Time: 13.6 seconds (ref 11.6–15.2)

## 2011-01-13 LAB — LIPASE, BLOOD: Lipase: 29 U/L (ref 11–59)

## 2011-01-13 LAB — URINALYSIS, ROUTINE W REFLEX MICROSCOPIC
Bilirubin Urine: NEGATIVE
Bilirubin Urine: NEGATIVE
Glucose, UA: NEGATIVE mg/dL
Hgb urine dipstick: NEGATIVE
Ketones, ur: 15 mg/dL — AB
Nitrite: NEGATIVE
Protein, ur: NEGATIVE mg/dL
Specific Gravity, Urine: 1.015 (ref 1.005–1.030)
Urobilinogen, UA: 1 mg/dL (ref 0.0–1.0)
Urobilinogen, UA: 2 mg/dL — ABNORMAL HIGH (ref 0.0–1.0)
pH: 8.5 — ABNORMAL HIGH (ref 5.0–8.0)

## 2011-01-13 LAB — LIPID PANEL
HDL: 39 mg/dL — ABNORMAL LOW (ref >39)
Total CHOL/HDL Ratio: 2.5 RATIO
VLDL: 16 mg/dL (ref 0–40)

## 2011-01-13 LAB — COMPREHENSIVE METABOLIC PANEL
ALT: 13 U/L (ref 0–53)
AST: 19 U/L (ref 0–37)
AST: 21 U/L (ref 0–37)
Albumin: 3.6 g/dL (ref 3.5–5.2)
Albumin: 3 g/dL — ABNORMAL LOW (ref 3.5–5.2)
BUN: 12 mg/dL (ref 6–23)
CO2: 25 mEq/L (ref 19–32)
Chloride: 104 mEq/L (ref 96–112)
Chloride: 101 mEq/L (ref 96–112)
Chloride: 104 mEq/L (ref 96–112)
Creatinine, Ser: 0.91 mg/dL (ref 0.4–1.5)
Creatinine, Ser: 0.92 mg/dL (ref 0.4–1.5)
GFR calc Af Amer: >60 mL/min (ref >60)
GFR calc Af Amer: >60 mL/min (ref >60)
GFR calc non Af Amer: >60 mL/min (ref >60)
Glucose, Bld: 201 mg/dL — ABNORMAL HIGH (ref 70–99)
Sodium: 134 mEq/L — ABNORMAL LOW (ref 135–145)
Total Bilirubin: 0.9 mg/dL (ref 0.3–1.2)
Total Bilirubin: 0.8 mg/dL (ref 0.3–1.2)
Total Bilirubin: 0.8 mg/dL (ref 0.3–1.2)
Total Protein: 6.4 g/dL (ref 6.0–8.3)
Total Protein: 6 g/dL (ref 6.0–8.3)

## 2011-01-13 LAB — CULTURE, BLOOD (ROUTINE X 2)

## 2011-01-13 LAB — BASIC METABOLIC PANEL
BUN: 7 mg/dL (ref 6–23)
Calcium: 8.6 mg/dL (ref 8.4–10.5)
Calcium: 8.7 mg/dL (ref 8.4–10.5)
Chloride: 104 mEq/L (ref 96–112)
Chloride: 106 mEq/L (ref 96–112)
Creatinine, Ser: 0.88 mg/dL (ref 0.4–1.5)
Creatinine, Ser: 0.97 mg/dL (ref 0.4–1.5)
GFR calc Af Amer: >60 mL/min (ref >60)
GFR calc Af Amer: >60 mL/min (ref >60)
GFR calc non Af Amer: >60 mL/min (ref >60)
Glucose, Bld: 127 mg/dL — ABNORMAL HIGH (ref 70–99)
Potassium: 3.3 mEq/L — ABNORMAL LOW (ref 3.5–5.1)
Sodium: 135 mEq/L (ref 135–145)
Sodium: 140 mEq/L (ref 135–145)

## 2011-01-13 LAB — URINE MICROSCOPIC-ADD ON

## 2011-01-13 LAB — POCT CARDIAC MARKERS
CKMB, poc: 1.2 ng/mL (ref 1.0–8.0)
Myoglobin, poc: 64.7 ng/mL (ref 12–200)

## 2011-01-13 LAB — GLUCOSE, CAPILLARY
Glucose-Capillary: 131 mg/dL — ABNORMAL HIGH (ref 70–99)
Glucose-Capillary: 75 mg/dL (ref 70–99)
Glucose-Capillary: 110 mg/dL — ABNORMAL HIGH (ref 70–99)
Glucose-Capillary: 147 mg/dL — ABNORMAL HIGH (ref 70–99)
Glucose-Capillary: 159 mg/dL — ABNORMAL HIGH (ref 70–99)
Glucose-Capillary: 190 mg/dL — ABNORMAL HIGH (ref 70–99)
Glucose-Capillary: 235 mg/dL — ABNORMAL HIGH (ref 70–99)
Glucose-Capillary: 58 mg/dL — ABNORMAL LOW (ref 70–99)

## 2011-01-13 LAB — DIFFERENTIAL
Basophils Absolute: 0 10*3/uL (ref 0.0–0.1)
Basophils Absolute: 0 10*3/uL (ref 0.0–0.1)
Basophils Relative: 0 % (ref 0–1)
Lymphocytes Relative: 30 % (ref 12–46)
Monocytes Absolute: 0.3 10*3/uL (ref 0.1–1.0)
Monocytes Relative: 6 % (ref 3–12)
Neutro Abs: 3.1 10*3/uL (ref 1.7–7.7)
Neutro Abs: 13.2 10*3/uL — ABNORMAL HIGH (ref 1.7–7.7)
Neutrophils Relative %: 62 % (ref 43–77)
Neutrophils Relative %: 87 % — ABNORMAL HIGH (ref 43–77)

## 2011-01-13 LAB — TROPONIN I: Troponin I: 0.02 ng/mL (ref 0.00–0.06)

## 2011-01-13 LAB — HEMOGLOBIN A1C
Hgb A1c MFr Bld: 6.4 % — ABNORMAL HIGH (ref <5.7)
Mean Plasma Glucose: 137 mg/dL — ABNORMAL HIGH (ref <117)

## 2011-01-13 LAB — BRAIN NATRIURETIC PEPTIDE: Pro B Natriuretic peptide (BNP): 334 pg/mL — ABNORMAL HIGH (ref 0.0–100.0)

## 2011-01-13 LAB — APTT: aPTT: 29 seconds (ref 24–37)

## 2011-01-14 LAB — POCT CARDIAC MARKERS: Troponin i, poc: <0.05 ng/mL (ref 0.00–0.09)

## 2011-01-14 LAB — URINALYSIS, ROUTINE W REFLEX MICROSCOPIC
Glucose, UA: 500 mg/dL — AB
Ketones, ur: NEGATIVE mg/dL
Nitrite: NEGATIVE
Protein, ur: NEGATIVE mg/dL
Urobilinogen, UA: 2 mg/dL — ABNORMAL HIGH (ref 0.0–1.0)

## 2011-01-14 LAB — COMPREHENSIVE METABOLIC PANEL
ALT: 10 U/L (ref 0–53)
AST: 42 U/L — ABNORMAL HIGH (ref 0–37)
Alkaline Phosphatase: 72 U/L (ref 39–117)
CO2: 28 mEq/L (ref 19–32)
Calcium: 9.1 mg/dL (ref 8.4–10.5)
Chloride: 107 mEq/L (ref 96–112)
GFR calc Af Amer: >60 mL/min (ref >60)
GFR calc non Af Amer: >60 mL/min (ref >60)
Glucose, Bld: 107 mg/dL — ABNORMAL HIGH (ref 70–99)
Sodium: 142 mEq/L (ref 135–145)
Total Bilirubin: 1.6 mg/dL — ABNORMAL HIGH (ref 0.3–1.2)

## 2011-01-14 LAB — DIFFERENTIAL
Basophils Absolute: 0 10*3/uL (ref 0.0–0.1)
Basophils Relative: 0 % (ref 0–1)
Eosinophils Absolute: 0.1 10*3/uL (ref 0.0–0.7)
Eosinophils Relative: 2 % (ref 0–5)
Neutrophils Relative %: 61 % (ref 43–77)

## 2011-01-14 LAB — CBC
HCT: 38.7 % — ABNORMAL LOW (ref 39.0–52.0)
Hemoglobin: 13.3 g/dL (ref 13.0–17.0)
MCH: 32.5 pg (ref 26.0–34.0)
MCHC: 34.4 g/dL (ref 30.0–36.0)
RBC: 4.09 MIL/uL — ABNORMAL LOW (ref 4.22–5.81)

## 2011-01-17 LAB — CBC
HCT: 39.5 % (ref 39.0–52.0)
MCHC: 34.9 g/dL (ref 30.0–36.0)
MCV: 93.6 fL (ref 78.0–100.0)
Platelets: 112 10*3/uL — ABNORMAL LOW (ref 150–400)
RBC: 4.22 MIL/uL (ref 4.22–5.81)
WBC: 6.3 10*3/uL (ref 4.0–10.5)

## 2011-01-17 LAB — DIFFERENTIAL
Basophils Relative: 0 % (ref 0–1)
Eosinophils Absolute: 0.1 10*3/uL (ref 0.0–0.7)
Eosinophils Relative: 1 % (ref 0–5)
Lymphs Abs: 1.8 10*3/uL (ref 0.7–4.0)
Monocytes Relative: 6 % (ref 3–12)

## 2011-01-17 LAB — URINALYSIS, ROUTINE W REFLEX MICROSCOPIC
Glucose, UA: NEGATIVE mg/dL
Ketones, ur: 15 mg/dL — AB
Nitrite: NEGATIVE
Specific Gravity, Urine: 1.025 (ref 1.005–1.030)
pH: 6.5 (ref 5.0–8.0)

## 2011-01-17 LAB — URINE MICROSCOPIC-ADD ON

## 2011-01-17 LAB — POCT I-STAT, CHEM 8
Creatinine, Ser: 0.7 mg/dL (ref 0.4–1.5)
Glucose, Bld: 154 mg/dL — ABNORMAL HIGH (ref 70–99)
Hemoglobin: 13.9 g/dL (ref 13.0–17.0)
TCO2: 30 mmol/L (ref 0–100)

## 2011-01-17 LAB — URINE CULTURE

## 2011-01-30 ENCOUNTER — Other Ambulatory Visit: Payer: Self-pay | Admitting: Family Medicine

## 2011-02-06 LAB — DIFFERENTIAL
Eosinophils Absolute: 0.3 10*3/uL (ref 0.0–0.7)
Lymphs Abs: 2 10*3/uL (ref 0.7–4.0)
Monocytes Relative: 6 % (ref 3–12)
Neutrophils Relative %: 58 % (ref 43–77)

## 2011-02-06 LAB — COMPREHENSIVE METABOLIC PANEL
ALT: 20 U/L (ref 0–53)
AST: 26 U/L (ref 0–37)
Albumin: 3.7 g/dL (ref 3.5–5.2)
Alkaline Phosphatase: 100 U/L (ref 39–117)
BUN: 11 mg/dL (ref 6–23)
CO2: 28 mEq/L (ref 19–32)
Calcium: 9.2 mg/dL (ref 8.4–10.5)
Chloride: 108 mEq/L (ref 96–112)
GFR calc Af Amer: >60 mL/min (ref >60)
Glucose, Bld: 237 mg/dL — ABNORMAL HIGH (ref 70–99)
Glucose, Bld: 247 mg/dL — ABNORMAL HIGH (ref 70–99)
Potassium: 3.7 mEq/L (ref 3.5–5.1)
Sodium: 144 mEq/L (ref 135–145)
Total Bilirubin: 1.2 mg/dL (ref 0.3–1.2)
Total Protein: 6.9 g/dL (ref 6.0–8.3)

## 2011-02-06 LAB — BASIC METABOLIC PANEL
BUN: 10 mg/dL (ref 6–23)
BUN: 9 mg/dL (ref 6–23)
CO2: 29 mEq/L (ref 19–32)
Calcium: 8.6 mg/dL (ref 8.4–10.5)
Calcium: 9.1 mg/dL (ref 8.4–10.5)
Creatinine, Ser: 0.91 mg/dL (ref 0.4–1.5)
GFR calc non Af Amer: >60 mL/min (ref >60)
GFR calc non Af Amer: >60 mL/min (ref >60)
Glucose, Bld: 229 mg/dL — ABNORMAL HIGH (ref 70–99)
Potassium: 3.3 mEq/L — ABNORMAL LOW (ref 3.5–5.1)
Sodium: 139 mEq/L (ref 135–145)
Sodium: 140 mEq/L (ref 135–145)

## 2011-02-06 LAB — GLUCOSE, CAPILLARY
Glucose-Capillary: 132 mg/dL — ABNORMAL HIGH (ref 70–99)
Glucose-Capillary: 140 mg/dL — ABNORMAL HIGH (ref 70–99)
Glucose-Capillary: 140 mg/dL — ABNORMAL HIGH (ref 70–99)
Glucose-Capillary: 150 mg/dL — ABNORMAL HIGH (ref 70–99)
Glucose-Capillary: 150 mg/dL — ABNORMAL HIGH (ref 70–99)
Glucose-Capillary: 152 mg/dL — ABNORMAL HIGH (ref 70–99)
Glucose-Capillary: 157 mg/dL — ABNORMAL HIGH (ref 70–99)
Glucose-Capillary: 161 mg/dL — ABNORMAL HIGH (ref 70–99)
Glucose-Capillary: 170 mg/dL — ABNORMAL HIGH (ref 70–99)
Glucose-Capillary: 170 mg/dL — ABNORMAL HIGH (ref 70–99)
Glucose-Capillary: 176 mg/dL — ABNORMAL HIGH (ref 70–99)
Glucose-Capillary: 178 mg/dL — ABNORMAL HIGH (ref 70–99)
Glucose-Capillary: 181 mg/dL — ABNORMAL HIGH (ref 70–99)
Glucose-Capillary: 183 mg/dL — ABNORMAL HIGH (ref 70–99)
Glucose-Capillary: 184 mg/dL — ABNORMAL HIGH (ref 70–99)
Glucose-Capillary: 194 mg/dL — ABNORMAL HIGH (ref 70–99)
Glucose-Capillary: 197 mg/dL — ABNORMAL HIGH (ref 70–99)
Glucose-Capillary: 216 mg/dL — ABNORMAL HIGH (ref 70–99)
Glucose-Capillary: 218 mg/dL — ABNORMAL HIGH (ref 70–99)
Glucose-Capillary: 223 mg/dL — ABNORMAL HIGH (ref 70–99)
Glucose-Capillary: 243 mg/dL — ABNORMAL HIGH (ref 70–99)
Glucose-Capillary: 250 mg/dL — ABNORMAL HIGH (ref 70–99)
Glucose-Capillary: 291 mg/dL — ABNORMAL HIGH (ref 70–99)
Glucose-Capillary: 297 mg/dL — ABNORMAL HIGH (ref 70–99)
Glucose-Capillary: 96 mg/dL (ref 70–99)

## 2011-02-06 LAB — PROTIME-INR
INR: 1.1 (ref 0.00–1.49)
Prothrombin Time: 14.9 seconds (ref 11.6–15.2)

## 2011-02-06 LAB — CBC
HCT: 35.1 % — ABNORMAL LOW (ref 39.0–52.0)
HCT: 38.4 % — ABNORMAL LOW (ref 39.0–52.0)
Hemoglobin: 12.3 g/dL — ABNORMAL LOW (ref 13.0–17.0)
Hemoglobin: 13.1 g/dL (ref 13.0–17.0)
Hemoglobin: 13.2 g/dL (ref 13.0–17.0)
MCHC: 34.5 g/dL (ref 30.0–36.0)
MCHC: 34.9 g/dL (ref 30.0–36.0)
Platelets: 115 10*3/uL — ABNORMAL LOW (ref 150–400)
Platelets: 129 10*3/uL — ABNORMAL LOW (ref 150–400)
Platelets: 131 10*3/uL — ABNORMAL LOW (ref 150–400)
RBC: 4.13 MIL/uL — ABNORMAL LOW (ref 4.22–5.81)
RDW: 12.9 % (ref 11.5–15.5)
RDW: 13.8 % (ref 11.5–15.5)
WBC: 5.8 10*3/uL (ref 4.0–10.5)
WBC: 6.5 10*3/uL (ref 4.0–10.5)

## 2011-02-06 LAB — POCT CARDIAC MARKERS: Troponin i, poc: <0.05 ng/mL (ref 0.00–0.09)

## 2011-02-06 LAB — URINALYSIS, ROUTINE W REFLEX MICROSCOPIC
Glucose, UA: >1000 mg/dL — AB
Hgb urine dipstick: NEGATIVE
Protein, ur: NEGATIVE mg/dL

## 2011-02-06 LAB — HEMOGLOBIN A1C
Hgb A1c MFr Bld: 8.4 % — ABNORMAL HIGH (ref 4.6–6.1)
Mean Plasma Glucose: 194 mg/dL

## 2011-02-06 LAB — CARDIAC PANEL(CRET KIN+CKTOT+MB+TROPI): Relative Index: 1 (ref 0.0–2.5)

## 2011-02-06 LAB — LIPID PANEL
HDL: 29 mg/dL — ABNORMAL LOW (ref >39)
Total CHOL/HDL Ratio: 3.2 RATIO
VLDL: 18 mg/dL (ref 0–40)

## 2011-02-06 LAB — URINE MICROSCOPIC-ADD ON

## 2011-02-06 LAB — VITAMIN B12: Vitamin B-12: 464 pg/mL (ref 211–911)

## 2011-02-16 ENCOUNTER — Other Ambulatory Visit: Payer: Self-pay | Admitting: Family Medicine

## 2011-02-28 ENCOUNTER — Other Ambulatory Visit: Payer: Self-pay | Admitting: *Deleted

## 2011-02-28 ENCOUNTER — Other Ambulatory Visit: Payer: Self-pay | Admitting: Emergency Medicine

## 2011-02-28 ENCOUNTER — Other Ambulatory Visit: Payer: Self-pay | Admitting: Family Medicine

## 2011-02-28 MED ORDER — PIOGLITAZONE HCL 45 MG PO TABS: 45.0000 mg | ORAL_TABLET | Freq: Every day | ORAL | Status: DC

## 2011-02-28 MED ORDER — FUROSEMIDE 40 MG PO TABS: 40.0000 mg | ORAL_TABLET | Freq: Every day | ORAL | Status: DC

## 2011-03-04 ENCOUNTER — Ambulatory Visit (INDEPENDENT_AMBULATORY_CARE_PROVIDER_SITE_OTHER): Payer: PRIVATE HEALTH INSURANCE | Admitting: Cardiovascular Disease

## 2011-03-04 ENCOUNTER — Encounter: Payer: Self-pay | Admitting: Cardiovascular Disease

## 2011-03-04 DIAGNOSIS — E785 Hyperlipidemia, unspecified: Secondary | ICD-10-CM

## 2011-03-04 DIAGNOSIS — I2510 Atherosclerotic heart disease of native coronary artery without angina pectoris: Secondary | ICD-10-CM

## 2011-03-04 DIAGNOSIS — I509 Heart failure, unspecified: Secondary | ICD-10-CM

## 2011-03-04 DIAGNOSIS — J449 Chronic obstructive pulmonary disease, unspecified: Secondary | ICD-10-CM

## 2011-03-04 DIAGNOSIS — I10 Essential (primary) hypertension: Secondary | ICD-10-CM

## 2011-03-04 DIAGNOSIS — E119 Type 2 diabetes mellitus without complications: Secondary | ICD-10-CM

## 2011-03-04 NOTE — Progress Notes (Signed)
   Patient ID: [name] Newman, male    DOB: 18-May-1941, 70 y.o.   MRN: [id number]  HPI Comments: 70 year old gentleman with a PMH of coronary artery disease, bypass surgery in January 03 with a LIMA to the LAD, sequential saphenous vein graft to the second and third obtuse marginals, saphenous vein graft to the first diagonal, sequential saphenous vein graft to the PDA and acute marginal with negative stress test in June 2010 with ejection fraction 39%, inferior, septal and inferolateral hypokinesis and scar with no ischemia, with history of falls and lower extremity weakness, history of syncope, remote smoking history who stopped in 2003, underlying diabetes who presents for routine followup.   Mr. Weathers  continues to have difficulty walking with chronic right lower extremity weakness.  he reports he is about the same with no chest pain, no shortness of breath. He does have some edema in his legs and takes Lasix on a daily basis. He is uncertain of many of his medicines. He has no complaints.  Recent cholesterol is at goal on his current medication regimen.  EKG shows normal sinus rhythm with rate 73 beats per minute with nonspecific ST changes in leads V5, V6, inferior leads, consider inferior infarct as well as anteroseptal infarct          Review of Systems  Constitutional: Positive for fever.  HENT: Negative.   Eyes: Negative.   Respiratory: Negative.   Cardiovascular: Positive for leg swelling.  Gastrointestinal: Negative.   Musculoskeletal: Negative.   Skin: Negative.   Neurological: Negative.   Hematological: Negative.   Psychiatric/Behavioral: Negative.   [all other systems reviewed and are negative      Physical Exam  [nursing notereviewed. Constitutional: He is oriented to person, place, and time. He appears well-developed and well-nourished.  HENT:  Head: Normocephalic.  Nose: Nose normal.  Mouth/Throat: Oropharynx is clear and moist.  Eyes: Conjunctivae are  normal. Pupils are equal, round, and reactive to light.  Neck: Normal range of motion. Neck supple. No JVD present.  Cardiovascular: Normal rate, regular rhythm, S1 normal, S2 normal, intact distal pulses and normal pulses.  Exam reveals no gallop and no friction rub.   Murmur heard.  Crescendo systolic murmur is present with a grade of 2/6  Pulses:      Carotid pulses are 2+ on the right side, and 2+ on the left side with bruit.      Radial pulses are 2+ on the right side, and 2+ on the left side.       Posterior tibial pulses are 2+ on the right side, and 2+ on the left side.  Pulmonary/Chest: Effort normal and breath sounds normal. No respiratory distress. He has no wheezes. He has no rales. He exhibits no tenderness.  Abdominal: Soft. Bowel sounds are normal. He exhibits no distension. There is no tenderness.  Musculoskeletal: Normal range of motion. He exhibits edema. He exhibits no tenderness.  Lymphadenopathy:    He has no cervical adenopathy.  Neurological: He is alert and oriented to person, place, and time. Coordination normal.  Skin: Skin is warm and dry. No rash noted. No erythema.  Psychiatric: He has a normal mood and affect. His behavior is normal. Judgment and thought content normal.           Assessment and Plan

## 2011-03-04 NOTE — Assessment & Plan Note (Signed)
Currently with no symptoms of angina. No further workup at this time. Continue current medication regimen. 

## 2011-03-04 NOTE — Assessment & Plan Note (Signed)
Hemoglobin A1c is 7.2. We have asked him to watch his diet more closely.

## 2011-03-04 NOTE — Assessment & Plan Note (Signed)
Cholesterol is at goal on the current lipid regimen. No changes to the medications were made.  

## 2011-03-04 NOTE — Patient Instructions (Signed)
You are doing well. No medication changes were made. Please call us if you have new issues that need to be addressed before your next appt.  We will call you for a follow up Appt. In 6 months  

## 2011-03-04 NOTE — Assessment & Plan Note (Signed)
Blood pressure is well controlled on today's visit. No changes made to the medications. 

## 2011-03-04 NOTE — Assessment & Plan Note (Signed)
Lungs are clear on exam. He does have trace edema. We have suggested to him that he watch his fluid intake and take extra water pill as needed for increasing edema. He is relatively sedentary. He denies shortness of breath.

## 2011-03-10 ENCOUNTER — Ambulatory Visit (INDEPENDENT_AMBULATORY_CARE_PROVIDER_SITE_OTHER): Payer: PRIVATE HEALTH INSURANCE | Admitting: Family Medicine

## 2011-03-10 ENCOUNTER — Encounter: Payer: Self-pay | Admitting: Family Medicine

## 2011-03-10 VITALS — BP 110/70 | HR 72 | Wt 200.1 lb

## 2011-03-10 DIAGNOSIS — D509 Iron deficiency anemia, unspecified: Secondary | ICD-10-CM

## 2011-03-10 DIAGNOSIS — E785 Hyperlipidemia, unspecified: Secondary | ICD-10-CM

## 2011-03-10 DIAGNOSIS — D696 Thrombocytopenia, unspecified: Secondary | ICD-10-CM | POA: Insufficient documentation

## 2011-03-10 DIAGNOSIS — E119 Type 2 diabetes mellitus without complications: Secondary | ICD-10-CM

## 2011-03-10 DIAGNOSIS — J4489 Other specified chronic obstructive pulmonary disease: Secondary | ICD-10-CM

## 2011-03-10 DIAGNOSIS — G20A1 Parkinson's disease without dyskinesia, without mention of fluctuations: Secondary | ICD-10-CM

## 2011-03-10 DIAGNOSIS — J449 Chronic obstructive pulmonary disease, unspecified: Secondary | ICD-10-CM

## 2011-03-10 DIAGNOSIS — G20 Parkinson's disease: Secondary | ICD-10-CM

## 2011-03-10 LAB — BASIC METABOLIC PANEL
GFR: 116.49 mL/min (ref >60.00)
Potassium: 4 mEq/L (ref 3.5–5.1)
Sodium: 143 mEq/L (ref 135–145)

## 2011-03-10 LAB — CBC WITH DIFFERENTIAL/PLATELET
Basophils Absolute: 0 10*3/uL (ref 0.0–0.1)
Eosinophils Absolute: 0.1 10*3/uL (ref 0.0–0.7)
Lymphocytes Relative: 30.9 % (ref 12.0–46.0)
MCHC: 33.8 g/dL (ref 30.0–36.0)
MCV: 92.3 fl (ref 78.0–100.0)
Monocytes Absolute: 0.3 10*3/uL (ref 0.1–1.0)
Neutrophils Relative %: 61.9 % (ref 43.0–77.0)
Platelets: 104 10*3/uL — ABNORMAL LOW (ref 150.0–400.0)

## 2011-03-10 LAB — HEMOGLOBIN A1C: Hgb A1c MFr Bld: 6.7 % — ABNORMAL HIGH (ref 4.6–6.5)

## 2011-03-10 NOTE — Patient Instructions (Signed)
F/u 6 months

## 2011-03-10 NOTE — Progress Notes (Signed)
70 year old:  Diabetes Mellitus: Tolerating Medications: y Compliance with diet: fair Exercise: none Foot problems: none Hypoglycemia: none No nausea, vomitting, blurred vision, polyuria. Much improved in the last year.  Lab Results  Component Value Date   HGBA1C 6.7* 03/10/2011      Chemistry      Component Value Date/Time   NA 143 03/10/2011 1306   K 4.0 03/10/2011 1306   CL 107 03/10/2011 1306   CO2 31 03/10/2011 1306   BUN 13 03/10/2011 1306   CREATININE 0.7 03/10/2011 1306      Component Value Date/Time   CALCIUM 9.2 03/10/2011 1306   ALKPHOS 83 11/24/2010 1225   AST 18 11/24/2010 1225   ALT 12 11/24/2010 1225   BILITOT 0.5 11/24/2010 1225     HTN: Tolerating all medications without side effects Stable and at goal No CP, no sob. No HA.  BP Readings from Last 3 Encounters:  03/10/11 110/70  03/04/11 118/62  11/24/10 100/60   Lipids: Doing well, stable. Tolerating meds fine with no SE. Panel reviewed with patient. LDL at goal  Lab Results  Component Value Date   CHOL  Value: 96        ATP III CLASSIFICATION:  <200     mg/dL   Desirable  161-096  mg/dL   Borderline High  >=045    mg/dL   High        02/06/8118   CHOL 115 07/15/2009   CHOL  Value: 94        ATP III CLASSIFICATION:  <200     mg/dL   Desirable  147-829  mg/dL   Borderline High  >=562    mg/dL   High        11/30/8655   Lab Results  Component Value Date   HDL 39* 07/30/2010   HDL 34* 07/15/2009   HDL 29* 05/16/2009   Lab Results  Component Value Date   LDLCALC  Value: 41        Total Cholesterol/HDL:CHD Risk Coronary Heart Disease Risk Table                     Men   Women  1/2 Average Risk   3.4   3.3  Average Risk       5.0   4.4  2 X Average Risk   9.6   7.1  3 X Average Risk  23.4   11.0        Use the calculated Patient Ratio above and the CHD Risk Table to determine the patient's CHD Risk.        ATP III CLASSIFICATION (LDL):  <100     mg/dL   Optimal  846-962  mg/dL   Near or Above                     Optimal  130-159  mg/dL   Borderline  952-841  mg/dL   High  >324     mg/dL   Very High 01/30/271   LDLCALC 60 07/15/2009   LDLCALC  Value: 47        Total Cholesterol/HDL:CHD Risk Coronary Heart Disease Risk Table                     Men   Women  1/2 Average Risk   3.4   3.3  Average Risk       5.0   4.4  2 X Average  Risk   9.6   7.1  3 X Average Risk  23.4   11.0        Use the calculated Patient Ratio above and the CHD Risk Table to determine the patient's CHD Risk.        ATP III CLASSIFICATION (LDL):  <100     mg/dL   Optimal  161-096  mg/dL   Near or Above                    Optimal  130-159  mg/dL   Borderline  045-409  mg/dL   High  >811     mg/dL   Very High 07/14/7828   Lab Results  Component Value Date   TRIG 78 07/30/2010   TRIG 106 07/15/2009   TRIG 88 05/16/2009   Lab Results  Component Value Date   CHOLHDL 2.5 07/30/2010   CHOLHDL 3.4 Ratio 07/15/2009   CHOLHDL 3.2 05/16/2009   Lab Results  Component Value Date   LDLDIRECT 41.5 11/24/2010   LDLDIRECT 123* 01/25/2007    Lab Results  Component Value Date   ALT 12 11/24/2010   AST 18 11/24/2010   ALKPHOS 83 11/24/2010   BILITOT 0.5 11/24/2010   Parkinson's / Dementia: No deterioration, cognitively the same per self and daughter  ROS: GEN: No acute illnesses, no fevers, chills. GI: No n/v/d, eating normally Pulm: occ SOB with exertion Interactive and getting along well at home.  Otherwise, ROS is as per the HPI.  GEN: WDWN, NAD, Non-toxic, A & O x 3 HEENT: Atraumatic, Normocephalic. Neck supple. No masses, No LAD. Ears and Nose: No external deformity. CV: RRR, No M/G/R. No JVD. No thrill. No extra heart sounds. PULM: CTA B, no wheezes, crackles, rhonchi. No retractions. No resp. distress. No accessory muscle use. EXTR: 1+ pedal edema NEURO Normal gait.  PSYCH: Normally interactive. Conversant. Not depressed or anxious appearing.  Calm demeanor.   Diabetic foot exam: Normal inspection No skin breakdown No calluses    Normal DP pulses Normal sensation to light tough and monofilament Nails normal

## 2011-03-15 NOTE — H&P (Signed)
NAME:  [name] Newman, [name] Newman             ACCOUNT NO.:  [id number]   MEDICAL RECORD NO.:  [id number]          PATIENT TYPE:  INP   LOCATION:  3001                         FACILITY:  [facility]   PHYSICIAN:  Lonia Blood, M.D.       DATE OF BIRTH:  04/27/1941   DATE OF ADMISSION:  05/15/2009  DATE OF DISCHARGE:                              HISTORY & PHYSICAL   PRIMARY CARE PHYSICIAN:  Spencer T. Copland, MD   CHIEF COMPLAINT:  Weak and falling.   HISTORY OF PRESENT ILLNESS:  [name] Newman is a 70 year old gentleman  with known ischemic cardiomyopathy status post coronary artery bypass  grafting in 2003, who was brought to the emergency room by his daughter  because of worsening right side weakness.  According to the daughter,  the patient suffered the stroke, post his coronary artery bypass  grafting with some right-sided weakness for which he has been using a  walker for the past 8 years.  According to the daughter for the past 2  days, the patient has been falling with his walker and he has been more  weak on the right side.  They are unable to tell me exactly when the  worsening weakness has started.  The patient currently denies any  shortness of breath, chest pain, or vertigo.   PAST MEDICAL HISTORY:  Diabetes, hypertension, coronary artery disease,  coronary artery bypass grafting in 2003, hyperlipidemia, and  hypertension.   HOME MEDICATIONS:  1. Gabapentin 600 mg twice a day.  2. Coreg 6.25 mg twice a day.  3. Omeprazole 20 mg daily.  4. Benazepril 10 mg daily.  5. Crestor 20 mg daily.  6. Detrol 4 mg daily.  7. Lasix 40 mg daily.  8. Aspirin 81 mg daily.  9. Lantus 21 units daily.   SOCIAL HISTORY:  The patient is chewing tobacco.  No alcohol and no  drugs.  He is under the care of his daughter who pays for a nurse  assistant 5 days a week.   FAMILY HISTORY:  Positive for coronary artery disease.   REVIEW OF SYSTEMS:  As per HPI.  All other systems reviewed are  negative.   PHYSICAL EXAMINATION:  VITAL SIGNS:  Upon admission, temperature 97.5,  heart rate 71, respiratory rate 18, blood pressure 108/65, saturation  80% on room air.  GENERAL:  The patient is alert, oriented to place.  He is unable to  provide full history, but he cooperates with exam pretty good.  HEENT:  Head, normocephalic, atraumatic.  Eyes, pupils equal, round, and  reactive to light and accommodation.  Extraocular movements intact.  Throat clear.  NECK:  Supple.  No JVD.  No carotid bruits.  CHEST:  Clear to auscultation without wheezes, rhonchi, or crackles.  HEART:  He has 2/6 systolic murmur at the apex as well as the right  upper sternal border without significant radiation.  ABDOMEN:  Soft, nontender, nondistended.  Bowel sounds are present.  LOWER EXTREMITIES:  Without edema.  NEUROLOGIC:  Cranial nerves II through XII intact.  Strength 4/5 in the  right upper extremity and 5/5 left  upper extremity.  The right lower  extremity strength is 3/5 and left lower extremity is 3/5.   LABORATORY DATA:  On admission, white blood cell count is 6.3,  hemoglobin is 14.7, platelet count 129.  Sodium 144, potassium 3.8,  chloride 107, bicarbonate 29, BUN 12, creatinine 0.9, glucose 237,  albumin 3.7.  Urinalysis is within normal limits.  A noncontrasted head  CT is negative for acute findings, positive for hydrocephalus though.   ASSESSMENT AND PLAN:  This is a 70 year old gentleman presenting with  signs and symptoms concerning for new stroke.  Also the findings on his  CT scan as well as his history of progressive decline raise the  possibility of normal-pressure hydrocephalus.  Plan is to admit Mr.  Newman, place him on telemetry and  Obtain MRI and MRA of his brain  and a complete stroke workup.  Based on the findings, we will proceed  with the further treatment of either stroke or the normal-pressure  hydrocephalus.  For now, we will change the patient's antiplatelet  medication to  Plavix and obtain also physical therapy and occupational  therapy consultation.  Until the situation with the stroke is clear, I  will hold the blood pressure medications.  For the patient's diabetes,  we will treat him with Lantus and sliding scale insulin while in house.  DVT prophylaxis will be done using Lovenox.      Lonia Blood, M.D.  Electronically Signed     SL/MEDQ  D:  05/15/2009  T:  05/16/2009  Job:  [id number]   cc:   Juleen China, MD

## 2011-03-15 NOTE — Consult Note (Signed)
NAME:  [name] Newman, [name] Newman             ACCOUNT NO.:  [id number]   MEDICAL RECORD NO.:  [id number]          PATIENT TYPE:  INP   LOCATION:  3001                         FACILITY:  [facility]   PHYSICIAN:  Stefani Dama, M.D.  DATE OF BIRTH:  1941/01/16   DATE OF CONSULTATION:  05/25/2009  DATE OF DISCHARGE:  05/26/2009                                 CONSULTATION   REQUESTING PHYSICIAN:  Lonia Blood, MD   REASON FOR REQUEST:  Normal pressure hydrocephalus.   HISTORY OF PRESENT ILLNESS:  Mr. [name] HJORT. Newman is a 70 year old  individual and I was asked to see for evaluation regarding normal  pressure hydrocephalus.  It seems that the patient has been failing with  his function developing not only dementia, but visual hallucinations in  addition to difficulty with ambulatory gait and also several months  difficulty with urinary and bowel incontinence.  He has had a history of  progressive decline for a least last 6 months time, perhaps a little bit  longer.  I have gotten the history from talking to the patient's 2  daughters.  They note that he has been having visual hallucinations for  at least 3 months, perhaps even a little bit longer and his level of  function has been deteriorating.  He had some near falls because of  difficulty with his balance.  A CT scan of his brain was performed this  demonstrated presence of markedly enlarged ventricles and he was seen by  Dr. Noel Christmas and suggests an evaluation for normal pressure  hydrocephalus.  This past Friday, the patient underwent an lumbar  puncture, which was performed in Radiology by the radiologist.  It is  unclear with the opening pressures or it is unclear the exact volume of  spinal fluid that was drained, but it is supposed that at least 10 mL  were drained.  The patient had pre-tapping and post-tapping test of  ambulation and he had a modest improvement in his ambulatory speed.  It  is unclear whether this was done by the  same observer nonetheless,  because there was some improvement his ambulatory speed.  Request for  evaluation for normal pressure hydrocephalus and possible treatment is  now undertaken.   PHYSICAL EXAMINATION:  GENERAL:  The patient is disoriented and somewhat  confused.  He believes he is in the hospital because he has had some  difficulty with incontinence.  Does not know how long he has had  incontinence, not aware of how long he has been in the hospital.  HEENT:  His pupils are 3 mm, briskly react to light and accommodation.  Extraocular movements are full.  Face is symmetric.  Tongue and uvula  are midline.  EXTREMITIES:  He has good motor function in his upper and his lower  extremities, however, on testing is gait he is markedly ataxic and  unable to maintain his balance with even a help with one full person.  Whether this is an acute change or this is a chronic process, is unclear  to me at this time.   Nonetheless, having reviewed the patient's  CT scans and noting that he  has had a singular lumbar puncture with some questionable modest  improvement in his ambulatory speed.  It is my feeling that the patient  has ex-vacuo changes with atrophy and this does not represent a true  condition of normal pressure hydrocephalus.  The patient is already  advanced in symptom complex having had urinary incontinence for several  months, ataxia and in addition, he has symptoms that are not completely  consistent with normal pressure hydrocephalus that is the  hallucinations.  I discussed the situation with the daughters and is my  feeling that he is not a candidate for a ventriculoperitoneal  shunting and do not believe that it will make a significant difference  in his overall level of function and indeed there are certain risks to a  ventriculoperitoneal shunting that need to be considered.  It has,  therefore, been advised that the patient be treated medically for this   process.      Stefani Dama, M.D.  Electronically Signed     HJE/MEDQ  D:  05/28/2009  T:  05/29/2009  Job:  [id number]

## 2011-03-15 NOTE — Assessment & Plan Note (Signed)
[facility] OFFICE NOTE   NAME:Newman Newman DHALIWAL                    MRN:          [id number]  DATE:03/27/2009                            DOB:          05/13/1941    PRIMARY CARE PHYSICIAN:  Newman Beat, MD   HISTORY OF PRESENT ILLNESS:  This is a 70 year old with history of  coronary artery disease status post coronary artery bypass grafting in  January 2003, ischemic cardiomyopathy with last EF 30%, hypertension,  and diabetes who presents to Cardiology Clinic for evaluation of his  cardiac condition.  The patient has not had followup with Cardiology for  several years.  In the past, he did see Dr. Gwen [facility] over at the  [facility].  He had an MI in 2003 and had bypass surgery at [facility] with LIMA to the LAD, sequential saphenous vein graft to OM2 and  OM3, a sequential saphenous vein graft to the PDA and an acute marginal,  and a saphenous vein graft to the first diagonal.  He also does have a  history of ischemic cardiomyopathy.  His last EF was 30%.  This is back  on an echo done in 2004.  The patient was first seen by [facility]  yesterday and was noted to be having some significant cardiac-type  symptoms.  He has been having increasing shortness of breath now for 4-5  months.  He has a nursing aide who comes in 5 days a week, and she does  tell me that she feels like over the last 4-5 months, his exercise  capacity has significantly decreased.  He is using a walker now at home  and he is using a wheelchair when he goes outside the house.  Prior to  about 5 months ago, he was able to walk around with a cane at home.  He  has, of note, had some difficulty with walking chronically due to right  leg pain and weakness since his bypass surgery.  He does tell me that  when walking around in his house with a walker, he is not short of  breath, however, if he has to go out to the mailbox which is about  100  yards away from his house, he has to stop 2 or 3 times due to shortness  of breath.  He has had no chest pain or tightness and no jaw pain in  years.  His anginal symptom with his heart attack was severe neck and  jaw pain.  He has had no PND and does not appear to have orthopnea, as  he sleeps on one pillow.  His last stress test appears to have been in  2004 over the [facility], we do not have the results of that here  with [place] today.  The patient's blood sugar also has not been under a  particularly good control and that is being worked on by [facility].   EKG, this shows normal sinus rhythm, old inferior MI, possible old  anterior lateral MI, and T-wave inversions in the inferior and lateral  leads.  ALLERGIES/INTOLERANCES:  LIPITOR.   Labs from May 2010, creatinine 0.9, potassium 4.7, hemoglobin A1c 7.6.   PAST MEDICAL HISTORY:  1. Prior back surgery.  2. Type 2 diabetes with poor compliance with the medications.  3. Hypertension.  4. Coronary artery disease status post MI in 2003 followed by coronary      artery bypass grafting in January 2003.  The patient has a LIMA to      the LAD, sequential saphenous vein graft to the second and third      obtuse marginals, saphenous vein graft to the first diagonal, and a      sequential saphenous vein graft to the PDA and an acute marginal.      The patient's most recent stress test appears to have been in 2004      at the [facility], we are trying to get results of that.  5. Ischemic cardiomyopathy with congestive heart failure.  The      patient's most recent echo appears to have been in July 2004, EF      was 30%.  There was moderate right atrial and left atrial      enlargement.  There is moderate left ventricular enlargement.      There is moderate mitral regurgitation, moderate tricuspid      regurgitation, moderate aortic insufficiency.  6. History of falls.  7. Hyperlipidemia.  8. History of cerebrovascular  accident.  9. Low back pain.  10.Hip pain.  11.Gastroesophageal reflux disease.  12.Prior smoker.  13.History of syncope.  14.Nephrolithiasis.   SOCIAL HISTORY:  The patient lives alone in [place].  He is a disabled  Nutritional therapist.  His sister lives near.  His daughter is here with him today  and is involved in his care, and he does have a Agricultural engineer who  comes in 5 days a week.  The patient is unable to read or write.   FAMILY HISTORY:  There is a family history of premature coronary artery  disease.   MEDICATIONS:  1. Benazepril 10 mg daily.  2. Metformin 1000 mg b.i.d.  3. Lopressor 50 mg b.i.d.  4. Crestor 10 mg daily.  5. Aspirin 81 mg daily.  6. Gabapentin.  7. Lasix 20 mg daily.  8. Glipizide XL 20 mg daily.  9. Lantus subcu.   REVIEW OF SYSTEMS:  All systems were reviewed and were negative except  as noted in the history of present illness.   PHYSICAL EXAMINATION:  VITAL SIGNS:  Blood pressure is 102/60, heart  rate is 82 and regular, weight is 190 Newman.  GENERAL:  This is a at chronically ill-appearing male in no apparent  distress.  NEUROLOGIC:  Alert and oriented x3.  Normal affect.  LUNGS:  Clear to auscultation bilaterally with normal respiratory  effort.  NECK:  JVP is mildly elevated about 8-9 cm of water.  There is no  thyromegaly or thyroid nodule.  HEENT:  Normal exam.  SKIN:  Normal exam.  CARDIOVASCULAR:  Heart regular.  S1 and S2.  No S3.  No S4.  There is a  2/6 holosystolic murmur at the apex.  There is also a 2/6 systolic  murmur at the base.  There is 1+ ankle edema.  There are 1+ posterior  tibial pulses bilaterally.  There is no carotid bruit.  ABDOMEN:  Obese, soft, nontender.  No hepatosplenomegaly.  Normal bowel  sounds.  EXTREMITIES:  No clubbing or cyanosis.   ASSESSMENT AND PLAN:  This is a  70 year old with a history of  hypertension, diabetes, coronary artery disease status post coronary  artery bypass graft and ischemic  cardiomyopathy.  He presents to  Cardiology Clinic for evaluation of his cardiac problems.  1. Coronary artery disease.  The patient had bypass surgery in 2003      and had an ischemic cardiomyopathy.  His anginal-type pain was jaw      and neck pain.  He has not had any jaw or neck or chest pain in      number of years now.  His last stress test was in 2004.  We will      try to get the results of that from the [facility].  For now,      we are going to continue him on his current regimen of medications      which include aspirin 81 mg daily, Crestor, and an ACE inhibitor.      I am going to change his beta-blocker, however, to carvedilol given      his reduced EF.  2. Congestive heart failure with ischemic cardiomyopathy.  The      patient's last known EF was 30% in 2004.  I expect that it is      probably the same or lower.  He is mildly volume overloaded.  He      does have New York Heart Association class III symptoms.  He has to      stop 2 or 3 times when he tries to walk for 100 yards.  This has      been slowly progressive over a number of months.  I think we will      first treat him symptomatically by increasing his to Lasix from 20      mg daily to 40 mg daily to see if we can relieve some of his      congestive symptoms.  I am also going to increase his benazepril to      15 mg a day.  He will follow up prior to his next visit with me in      2 weeks for a Chem-7.  I am also going to stop his metoprolol      completely and instead put him on carvedilol 6.25 mg twice a day      given his depressed ejection fraction.  I went over these      medication changes extensively with the patient and with his      nursing assistant who takes care of his medications for the most      part.  Everything was written out for her, and she will arrange the      pillbox.  Finally, the patient has also been set up to get an      echocardiogram, and we will wait the results of that.  The  patient      most likely will be a candidate for an ICD for primary prevention.      We will address this once the results of his echo are seen.  3. Murmur.  The patient does have murmur consistent with mitral      regurgitation.  He is set up for an echo and we will see what his      valves look like at that time.  4. Diabetes.  The patient is being treated for this by [facility].      It is noted that his medication compliance has been  poor in the      past.  Metformin may be a difficult medication for him given his      congestive heart failure and it might be that he should go off of      metformin and on to insulin.  I will leave this to Newman [facility],      however.  5. Hyperlipidemia.  We will check the patient's lipids and LFTs on      Crestor.  Goal LDL for him      will be less than 70.  6. I will see the patient back in the office in 2 weeks.     Marca Ancona, MD  Electronically Signed    DM/MedQ  DD: 03/27/2009  DT: 03/28/2009  Job #: [id number]   cc:   Juleen China, MD

## 2011-03-15 NOTE — Discharge Summary (Signed)
NAME:  [name] Newman, [name] Newman             ACCOUNT NO.:  [id number]   MEDICAL RECORD NO.:  [id number]          PATIENT TYPE:  INP   LOCATION:  3001                         FACILITY:  [facility]   PHYSICIAN:  Lonia Blood, M.D.       DATE OF BIRTH:  1941-07-08   DATE OF ADMISSION:  05/15/2009  DATE OF DISCHARGE:  05/26/2009                               DISCHARGE SUMMARY   PRIMARY CARE PHYSICIAN:  Juleen China, MD, [facility].   DISCHARGE DIAGNOSES:  1. Worsening weakness, unclear etiology - the patient did not have an      acute stroke.  2. Ventriculomegaly, normal pressure hydrocephalous versus advancing      dementia.  3. History of lumbar spine fusion with MRI of the spine, not      indicating any major spinal canal impingement, question of L4 nerve      root impingement on the right.  4. Parkinsonism.  5. Right foot drop.  6. Hypertension.  7. Diabetes mellitus, type 2.  8. Coronary artery disease status post coronary artery bypass grafting      in 2003.  9. Hyperlipidemia.  10.Gastroesophageal reflux disease.  11.Chronic systolic congestive heart failure.  12.Chronic diastolic congestive heart failure.  13.Gastroesophageal reflux disease.   DISCHARGE MEDICATIONS:  1. Neurontin 600 mg at bedtime.  2. Carvedilol 6.25 mg twice a day.  3. Omeprazole 20 mg daily.  4. Crestor 10 mg at bedtime.  5. Detrol LA 4 mg daily.  6. Lantus 20 units at bedtime.  7. Plavix 75 mg daily.  8. Sinemet 25/100 mg 3 times a day.   CONDITION ON DISCHARGE:  [name] Newman is discharged in fair condition.  He is able to ambulate with a walker.  He will follow up with primary  care physician, Dr. Kerin Perna, next week and with Dr. Terrace Arabia from  Neurology.   CONSULTATIONS THIS ADMISSION:  The patient was seen in consultation by  the Neurology service, as well as Dr. Danielle Dess from Neurosurgery.   PROCEDURE DURING THIS ADMISSION:  1. The patient underwent an MRI/MRA of the brain with findings  of      negative for acute stroke and intracranial atherosclerosis.  2. MRI of the lumbar spine with findings of lumbar fusion at L2      through L5, impingement upon the right L4 nerve root in the right      neural foramen due to combination of spurring and disk bulging.  3. Diagnostic lumbar puncture on May 22, 2009.  4. Carotid ultrasound, which was negative for significant carotid      artery stenosis.  5. Transthoracic echocardiogram with findings of dilated left      ventricle with depressed ejection fraction, mild aortic stenosis,      mild tricuspid regurgitation.   HISTORY AND PHYSICAL:  Refer to dictated H and P done by Dr. Lavera Guise.   HOSPITAL COURSE:  [name] Newman is a 70 year old gentleman with multiple  medical problems, who was brought in by his family for worsening  weakness and falls.  Initial evaluation raised the possibility of a new  lacunar stroke, and the patient was admitted as a possible acute stroke.  MRI of the brain failed to confirm a new stroke and our attention  turned towards the fact that the patient has been getting progressively  weak for the past 6 months, falling and having urinary incontinence.  A  question was raised about possiblity of normal pressure hydrocephalus  and [name] Newman was seen by 3 different neurologists, all of them  feeling that this could be a possibility.  Eventually, the patient  underwent a lumbar puncture with some improvement in his gait.  He was  seen by Dr. Danielle Dess, from neurosurgery who felt that the net improvement  from the lumbar puncture was not enough to merit a ventriculoperitoneal  shunt.  [name] Newman has improved during this hospitalization with  physical therapy and medication adjustments, and he will be discharged  home with home health PT, OT and to follow up with Neurology as an  outpatient, as well as with primary care physician.      Lonia Blood, M.D.  Electronically Signed     SL/MEDQ  D:  05/26/2009   T:  05/27/2009  Job:  [id number]   cc:   Juleen China, MD  [facility]

## 2011-03-15 NOTE — Consult Note (Signed)
NAME:  [name] Newman, [name] Newman             ACCOUNT NO.:  [id number]   MEDICAL RECORD NO.:  [id number]          PATIENT TYPE:  INP   LOCATION:  3001                         FACILITY:  [facility]   PHYSICIAN:  Noel Christmas, MD    DATE OF BIRTH:  Jul 19, 1941   DATE OF CONSULTATION:  05/16/2009  DATE OF DISCHARGE:                                 CONSULTATION   REFERRING SERVICE:  [facility].   REASON FOR CONSULTATION:  Progressive gait disturbance as well as memory  difficulty; rule out normal-pressure hydrocephalus.   HISTORY OF PRESENT ILLNESS:  This is a 70 year old man with a history of  right-sided weakness since 2003, secondary to cerebral infarction,  presenting with increasing difficulty with gait stability.  The patient  is describing increasing weakness of his right lower extremity.  He has  been falling including with use of his walker.  Family members have also  reported progressive memory difficulty.  The patient has also been  experiencing bouts of urinary incontinence.  He has a history of dilated  lateral, third and fourth ventricles on CT scan, the last of which was  done in January 2009.  Repeat CT study on current admission showed no  significant changes.  Study shows dilated ventricles, including third  left ventricle, fourth ventricle as well as signs of subependymal  resorption of cerebral spinal fluid as well as effacement of sulci at  the vertex.  Admission laboratory studies were unremarkable including  electrolytes and blood sugar.  There was no indication of an  intercurrent infectious process.   PAST MEDICAL HISTORY:  1. Hypertension.  2. Diabetes mellitus.  3. Coronary artery disease with coronary artery bypass graft surgery      in 2003.  4. Hyperlipidemia.  5. GERD.   CURRENT MEDICATIONS:  1. Neurontin 600 mg twice a day.  2. Coreg 6.25 mg twice a day.  3. Prilosec 20 mg per day.  4. Benazepril 10 mg per day.  5. Crestor 20 mg per day.  6. Detrol 4 mg per day.  7. Lasix 5 mg per day.  8. Plavix 75 mg per day.  9. Lantus 21 units daily.   FAMILY HISTORY:  Positive for coronary artery disease, but negative for  stroke.   PHYSICAL EXAMINATION:  GENERAL APPEARANCE:  A slightly elderly-appearing  man who is slender to medium built.  NEUROLOGIC:  He was alert and cooperative, in no acute distress.  He was  disoriented to time including correct day of the week as well as correct  date including correct month.  He was also slightly disoriented to  place.  He remembers 0 of 3 words after 3 minutes.  His long-term memory  was moderately impaired.  Pupils were equal and reactive normally to  light.  Extraocular movements were fully conjugate.  Visual fields were  intact and normal.  There was no facial weakness.  Hearing was normal.  He had asymmetric palatal movement with reduced elevation on the right  compared to the left.  Motor exam showed mild deltoid weakness on the  right as well as moderate hip  flexor, quadriceps, and hamstring  weakness.  Tibialis anterior muscles showed marked weakness with only  2/5 strength on the right.  Strength on his left extremity was normal.  Deep tendon reflexes were absent throughout.  He had moderate frontal  release signs.  His coordination was normal.  Carotid auscultation was  normal.   CLINICAL IMPRESSION:  1. Probable normal-pressure hydrocephalus is etiology for progressive      gait instability as well as progressive memory difficulty and      relatively new onset of urinary incontinence.  2. 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  3. No signs of acute cerebral infarction.   RECOMMENDATIONS:  1. MRI of the brain is planned.  2. We will schedule lumbar puncture with Radiology for early next      week.  3. Physical Therapy evaluation with evaluation of gait prior to and      after lumbar puncture.  4. Carotid Doppler  study.  5. Continue Plavix 75 mg per day.   Thank you for asking me to evaluate Mr. Dimmick.      Noel Christmas, MD  Electronically Signed     CS/MEDQ  D:  05/16/2009  T:  05/17/2009  Job:  [id number]

## 2011-03-15 NOTE — Assessment & Plan Note (Signed)
[facility]                                 ON-CALL NOTE   NAME:[name], [name]                    MRN:          [id number]  DATE:03/28/2009                            DOB:          Nov 05, 1940    I received a page today, Mar 28, 2009, from [name] regarding Mr.  [name] Newman recent switch from metoprolol to carvedilol.  [name]  was concerned that this perhaps was not a switch, but just an addition  of an another medication.  I clarified the issue regarding the  importance of [name], taking only the carvedilol.  [name]  indicated  that she understood these instructions and had no further questions or  concerns.     Jarrett Ables, [facility]  Electronically Signed    MS/MedQ  DD: 03/28/2009  DT: 03/29/2009  Job #: [id number]

## 2011-03-18 NOTE — Discharge Summary (Signed)
[place]  Patient:    [name] Newman, [name] Newman Visit Number: [id number] MRN: [id number]          Service Type: MED Location: 2000 2001 01 Attending Physician:  Charlett Lango Dictated by:   Maxwell Marion, RNFA Admit Date:  11/09/2001 Discharge Date: 11/17/2001   CC:         Dr. Delfino Lovett, [place]  Lamar Blinks, M.D., [facility], [place]   Discharge Summary  DATE OF BIRTH:  [birth year].  DATE OF SURGERY:  November 12, 2001.  ADMITTING DIAGNOSIS:  Three vessel coronary artery disease, status post acute myocardial infarction.  PAST MEDICAL HISTORY: 1. Diabetes mellitus type 2. 2. Tobacco use. 3. Borderline hypertension.  ALLERGIES:  This patient has no known drug allergies.  DISCHARGE DIAGNOSIS:  Three vessel coronary artery disease, status post coronary artery bypass grafting.  HISTORY OF PRESENT ILLNESS:  The patient is a 70 year old Caucasian man who presented to [facility] on November 09, 2001 with a one-week history of waxing and waning chest pain.  He was evaluated there by Dr. Arnoldo Hooker, initially treated with beta blockers, aspirin, nitroglycerin, and heparin.  He eventually ruled in for MI by his cardiac enzymes.  Cardiac catheterization was recommended and performed by Dr. Gwen Pounds which revealed three vessel coronary artery disease including critical left main disease with an ejection fraction of approximately 35%. Dr. Gwen Pounds recommended transfer to [facility] for further evaluation and treatment.  HOSPITAL COURSE:  On November 09, 2001, the patient was transferred and admitted to [facility] in the care of Dr. Charlett Lango.  On transfer, he was hemodynamically stable and pain-free.  After examination of the patient and review of the records, including the cardiac catheterization, films, Dr. Dorris Fetch recommended coronary artery bypass grafting as  the preferred treatment for this gentleman.  This was planned for November 12, 2001 (Monday).  On November 10, 2001, he underwent Doppler studies which revealed no significant carotid artery disease, his ABIs were noted to be 1.0 bilaterally.  On November 12, 2001, the patient underwent an uncomplicated coronary artery bypass grafting x 6 by Dr. Charlett Lango.  Grafts placed at time of procedure were the left internal mammary artery to the left anterior descending, saphenous venous graft to the diagonal artery, saphenous venous graft in a sequential fashion to the acute marginal and posterior descending artery, saphenous vein was grafted in a sequential fashion to the second obtuse marginal and third obtuse marginal.  Vein was harvested from the right for the bypass grafts.  The patient tolerated the procedure well and was transferred in stable condition to the SICU.  Throughout the patients postoperative course, he has remained hemodynamically stable and his postoperative course has been uneventful.  Diabetes educator did visit with the patient and provided information regarding diabetes education program at [facility].  He was also visited by the smoking cessation counselor and says that he has no further plans to smoke.  The patient is progressing very well and recovering from his surgery and he is ready for discharge home this morning, November 17, 2001.  CONDITION ON DISCHARGE:  Improved.  INSTRUCTIONS ON DISCHARGE INCLUDE MEDICATIONS, ACTIVITIES, WOUND CARE, DIET AND FOLLOWUP APPOINTMENTS:  Please see the discharge instruction sheet for details.  MEDICATIONS ON DISCHARGE: 1. Enteric-coated aspirin 325 mg p.o. q.d. 2. Tylox one to two p.o. q.4-6h. p.r.n. for pain. 3. Lopressor 25 mg p.o. q.12h. 4. Pravachol 40 mg p.o. q.h.s. 5. Glucotrol XL  10 mg p.o. q.d.  FOLLOWUP: 1. Home health services will be arranged for sternal wound check and care. 2. He has been  instructed to call Dr. Annice Needy office and obtain an    appointment to see him in approximately two weeks. 3. He has appointment to see Dr. Dorris Fetch at the [facility] office on December 05, 2001 and he has been asked to get a chest x-ray an hour prior to that    appointment. 4. He has also been instructed to follow up with his primary care physician,    Dr. Atha Starks within one month, specifically regarding his diabetes. Dictated by:   Maxwell Marion, RNFA Attending Physician:  Charlett Lango DD:  11/17/01 TD:  11/19/01 Job: [id number]

## 2011-03-18 NOTE — Op Note (Signed)
NAME:  LASON, EVELAND                       ACCOUNT NO.:  [id number]   MEDICAL RECORD NO.:  [id number]                   PATIENT TYPE:  OIB   LOCATION:  3172                                 FACILITY:  [facility]   PHYSICIAN:  Kathaleen Maser. Pool, M.D.                 DATE OF BIRTH:  1941-06-13   DATE OF PROCEDURE:  07/02/2003  DATE OF DISCHARGE:                                 OPERATIVE REPORT   SERVICE:  Neurosurgery.   PREOPERATIVE DIAGNOSES:  Right L4-L5 extraforaminal herniated nucleus  pulposus with radiculopathy.   POSTOPERATIVE DIAGNOSIS:  Right L4-L5 extraforaminal herniated nucleus  pulposus with radiculopathy.   PROCEDURE:  Right L4-L5 extraforaminal decompression with extraforaminal  microdiskectomy.   SURGEON:  Kathaleen Maser. Pool, M.D.   ASSISTANT:  Donalee Citrin, M.D.   ANESTHESIA:  General endotracheal.   INDICATIONS FOR PROCEDURE:  Mr. Blanchfield is a 70 year old male with a  history of back and right lower extremity pain consistent with a right sided  L4 radiculopathy which has failed all attempts at conservative management.  MRI scanning demonstrates evidence of multiple level degenerative disc  disease status post previous laminectomies at L3-L4 through the sacrum.  The  patient has a significant amount of foraminal stenosis and extraforaminal  stenosis caused by a broad based extraforaminal disc bulge compressing the  exiting L4 nerve root at the L4-L5 level.  We have discussed the options L4  management including possibly undergoing an extraforaminal decompression.  Depending on the results of the extraforaminal decompression, we may decide  to decompress the root from within the spinal canal, as well.   DESCRIPTION OF PROCEDURE:  The patient was brought to the operating room and  placed on the table in a supine position.  After an adequate level of  anesthesia was achieved, the patient was positioned prone on the Wilson  frame and properly padded for operation in the  lumbar region.  He was  prepped and draped sterilely.  A skin incision was made overlying the L4-L5  interspace.  This was carried down sharply in the midline.  Subperiosteal  dissection was performed exposing the right L4-L5 facet joint complex and  right L4 transverse process.  An x-ray was taken and the level was  confirmed.  An extraforaminal approach was then performed using the high  speed drill and Kerrison rongeurs to remove the lateral aspect of the L4-L5  facet joint complex, the lateral aspect of the pars intra-articularis, and  the inferior aspect of the transverse process of L4.  The intertransverse  ligament was elevated and resected in a piecemeal fashion using Kerrison  rongeurs.  The underlying L4 nerve root was identified and the microscope  was brought on the field and used for microdissection of the L4 nerve root  and underlying disc herniation.  The L4 nerve root was mobilized.  The  underlying disc herniation was apparent.  This was then incised  with a 15  blade.  A wide disc space clean out was then achieved using pituitary  rongeurs, upward angled pituitary rongeurs, and Epstein curets.  All disc  herniation were completely resected.  All loose disc material was removed  from the interspace.  At this point, a very thorough decompression had been  achieved.  A blunt probe passed easily along the cords to the exiting L4  nerve root and back into the canal without any evidence of any further  constriction.  The wound was then irrigated with antibiotic solution,  Gelfoam was placed and hemostasis was found to be good.  The microscope and  retractors were removed.  Hemostasis in the muscle was achieved with  electrocautery.  The  wounds were closed in layers with Vicryl sutures.  Steri-Strips and sterile  dressing were applied.  There were no complications.  The patient tolerated  the procedure well and he was returned to the recovery room in stable  condition.                                                Henry A. Pool, M.D.    HAP/MEDQ  D:  07/02/2003  T:  07/02/2003  Job:  [id number]

## 2011-03-18 NOTE — Op Note (Signed)
[place]  Patient:    [name] Newman, [name] Newman Visit Number: [id number] MRN: [id number]          Service Type: MED Location: 2300 [phone] Attending Physician:  Charlett Lango Dictated by:   Salvatore Decent. Dorris Fetch, M.D. Proc. Date: 11/12/01 Admit Date:  11/09/2001   CC:         Lamar Blinks, M.D., [facility], [place], [place]   Operative Report  INCOMPLETE  PREOPERATIVE DIAGNOSIS:  Severe three-vessel disease, status post acute inferior myocardial infarction.  POSTOPERATIVE DIAGNOSIS:  Severe three-vessel disease, status post acute inferior myocardial infarction.  PROCEDURES:  Median sternotomy, extracorporeal circulation, coronary artery bypass grafting x 6 (left internal mammary artery to the left anterior descending coronary artery, saphenous vein graft to first diagonal, sequential saphenous vein graft to obtuse marginal 2 and 3, sequential saphenous vein graft to acute marginal and posterior descending).  SURGEON:  Salvatore Decent. Dorris Fetch, M.D.  ASSISTANTMaxwell Marion, R.N.F.A. Dictated by:   Salvatore Decent Dorris Fetch, M.D. Attending Physician:  Charlett Lango DD:  11/12/01 TD:  11/12/01 Job: [id number]

## 2011-03-18 NOTE — Op Note (Signed)
[facility]  Patient:    [name] Newman, [name] Newman Visit Number: [id number] MRN: [id number]          Service Type: MED Location: 2300 [phone] Attending Physician:  Charlett Lango Dictated by:   Salvatore Decent. Dorris Fetch, M.D. Proc. Date: 11/12/01 Admit Date:  11/09/2001   CC:         Lamar Blinks, [facility], [place], [place]   Operative Report  PREOPERATIVE DIAGNOSIS:  Left main and three-vessel disease, status post acute myocardial infarction.  POSTOPERATIVE DIAGNOSIS:  Left main and three-vessel disease, status post acute myocardial infarction.  PROCEDURES:  Median sternotomy, extracorporeal circulation, coronary artery bypass grafting x 6 (left internal mammary artery to the left anterior descending coronary artery, saphenous vein graft to first diagonal, sequential saphenous vein graft to obtuse marginal 2 and 3, sequential saphenous vein graft to acute marginal and posterior descending).  SURGEON:  Salvatore Decent. Dorris Fetch, M.D.  ASSISTANTMaxwell Marion, R.N.F.A.  ANESTHESIA:  General.  FINDINGS:  Inferior scar, left ventricular hypertrophy, COPD.  Fair-quality targets, diffusely diseased, and good-quality conduits.  CLINICAL NOTE:  [name] Newman is a 70 year old diabetic former smoker who presented with recent acute myocardial infarction in [place], [place].  He ruled in by enzymes.  Underwent cardiac catheterization, which revealed left main disease and total occlusion of his right coronary.  The patient was referred for surgical revascularization.  The indications, risks, benefits, and alternatives were discussed in detail with the patient and his family.  They understood and accepted the risks and agreed to proceed.  DESCRIPTION OF PROCEDURE:  [name] Newman was brought to the preop holding area on November 12, 2001.  Lines were placed to monitor arterial, central venous, and pulmonary arterial pressure.  EKG leads were  placed for continuous telemetry.  The patient was taken to the operating room, anesthetized, and intubated.  A Foley catheter was placed.  Intravenous antibiotics were administered.  The chest, abdomen, and legs were prepped and draped in the usual fashion.  A median sternotomy was performed.  The left internal mammary artery was harvested in the standard fashion.  It was of good quality.  Simultaneously incision was made in the medial aspect of the right leg, and the greater saphenous vein was harvested from the ankle to the midthigh, again using standard techniques.  It was thick-walled but was relatively good quality. The patient was fully heparinized prior to dividing the distal end of the mammary artery.  There was good flow through the cut end of the mammary.  The mammary was placed in a papaverine-soaked sponge.  The pericardium was opened.  The ascending aorta was inspected and palpated. There was no palpable atherosclerotic disease.  The aorta was cannulated via concentric 2-0 Ethibond pledgeted pursestring sutures.  A dual-stage venous cannula was placed via pursestring suture in the right atrial appendage. Cardiopulmonary bypass was instituted, and the patient was cooled to 32 degrees Celsius.  The coronary arteries were inspected and anastomotic sites were chosen.  The conduits were inspected and cut to length.  A foam pad was placed in the pericardium to protect the left phrenic nerve, a temperature probe was placed in the myocardial septum, and a cardioplegia cannula was placed in the ascending aorta.  The aorta was crossclamped.  The left ventricle was emptied via the aortic root vent.  Cardiac arrest then was achieved with a combination of cold antegrade blood cardioplegia and topical iced saline.  After achieving a complete diastolic arrest with a myocardial  septal temperature of 12 degrees Celsius, the following distal anastomoses were performed.  First a reversed  saphenous vein graft was placed sequentially to the acute marginal and posterior descending branches of the right coronary.  The acute marginal was a 1.5 mm vessel, as was the posterior descending.  The right coronary was total just after the takeoff of the acute marginal.  Both vessels were diffusely diseased, as were all the coronaries, and of fair quality. Both anastomoses were performed with running 7-0 Prolene sutures.  At completion of the final anastomosis, cardioplegia was administered through the graft.  There was good flow and good hemostasis.  Next a reversed saphenous vein graft was placed sequentially to obtuse marginals 2 and 3 of the left circumflex coronary artery.  OM-2 was totally occluded and filled via collaterals on catheterization.  It was a 1.5 mm fair-quality target.  OM-3 was also 1.5 mm in diameter and was compromised by multiple circumflex stenoses.  Again a side-to-side was performed to OM-2 and an end-to-side to OM-3.  This was performed with running 7-0 Prolene sutures. Both anastomoses were probed proximally and distally prior to tying the suture.  There was good flow through the graft and good hemostasis.  Next a reversed saphenous vein graft was placed end-to-side to the first diagonal branch of the LAD.  This was a 1.5 mm fair-quality target.  The anastomosis was performed with a running 7-0 Prolene suture in an end-to-side fashion.  There was excellent flow through the graft.  Cardioplegia was administered, and there was good hemostasis.  Next the left internal mammary artery was brought through a window in the pericardium.  The distal end of the mammary was spatulated.  It was anastomosed end-to-side to the distal LAD.  The LAD was diffusely diseased with plaque palpable throughout its entire course.  Anteriorly it was of good quality at the site of anastomosis but did have posterior plaque throughout the length of the artery.  A 1.5 mm probe did pass  all the way to the apex. The anastomosis was performed with a running 8-0 Prolene suture.  At the  completion of the mammary to LAD anastomosis, the bulldog clamp was removed from the mammary artery.  Immediate and rapid septal rewarming was noted. Lidocaine was administered.  The mammary pedicle was tacked to the epicardial surface of the heart with 6-0 Prolene sutures.  The aortic crossclamp was removed with a total crossclamp time of 65 minutes.  The patient required a single defibrillation with 20 joules and then resumed sinus rhythm.  The partial occlusion clamp was placed on the ascending aorta, the cardioplegia cannula was removed.  The proximal vein graft anastomoses were performed to 4.0 mm punch aortotomies with running 6-0 Prolene sutures.  At the completion of the final proximal anastomosis, the patient was placed in Trendelenburg position, and air was allowed to vent as the partial clamp was removed.  Air was aspirated from the vein grafts.  The bulldog clamps were removed.  All proximal and distal anastomoses were inspected for hemostasis. Epicardial pacing wires were placed on the right ventricle and the right atrium.  When the patient had been rewarmed to a core temperature of 37 degrees Celsius, he was weaned from cardiopulmonary bypass.  Total bypass time was 118 minutes.  The initial cardiac index after coming off pump was greater than 2 L/min. per sq. m.  The patient did have high pulmonary arterial pressure readings in the 50s, PA systolic, and over the next  several minutes the pressures seemed to rise with PA pressures of 60/9.  Thinking it might be in the RV, an attempt was made to advance the Swan, which resulted in immediate wedge tracing.  Dr. Krista Blue from anesthesia then evaluated the Swan pullback and then re-floated the Swan, at which point the PA pressures were 25/12 with a good tracing.  The cardiac index at this point was 2 L/min. per sq. m.  The patient  remained hemodynamically stable throughout the postbypass period.  A test dose of protamine was administered and was well-tolerated. The atrial and aortic cannulae were removed.  The remainder of the protamine was administered without incident.  Hemostasis was achieved.  The pericardium was reapproximated with interrupted 3-0 silk sutures.  A left pleural and two mediastinal chest tubes were placed through separate subcostal incisions.  The sternum was closed with heavy-gauge interrupted stainless steel wires, the pectoralis fascia was closed with a running #1 Vicryl suture, the subcutaneous tissue was closed with a running 2-0 Vicryl suture, and the skin was closed with a 3-0 Vicryl subcuticular suture.  All sponge, needle, and instrument counts were correct at the end of the procedure.  The patient was taken from the operating room to the surgical intensive care unit. Dictated by:   Salvatore Decent Dorris Fetch, M.D. Attending Physician:  Charlett Lango DD:  11/12/01 TD:  11/12/01 Job: [id number]

## 2011-04-12 ENCOUNTER — Other Ambulatory Visit: Payer: Self-pay | Admitting: Cardiology

## 2011-04-12 ENCOUNTER — Other Ambulatory Visit: Payer: Self-pay | Admitting: Family Medicine

## 2011-04-12 NOTE — Telephone Encounter (Signed)
Refill request

## 2011-04-24 ENCOUNTER — Other Ambulatory Visit: Payer: Self-pay | Admitting: Family Medicine

## 2011-04-25 ENCOUNTER — Other Ambulatory Visit: Payer: Self-pay | Admitting: Family Medicine

## 2011-04-25 ENCOUNTER — Other Ambulatory Visit: Payer: Self-pay | Admitting: Cardiology

## 2011-04-26 ENCOUNTER — Other Ambulatory Visit: Payer: Self-pay | Admitting: Emergency Medicine

## 2011-04-27 ENCOUNTER — Other Ambulatory Visit: Payer: Self-pay | Admitting: Emergency Medicine

## 2011-04-27 MED ORDER — CARVEDILOL 6.25 MG PO TABS: 6.2500 mg | ORAL_TABLET | Freq: Two times a day (BID) | ORAL | Status: DC

## 2011-05-16 ENCOUNTER — Other Ambulatory Visit: Payer: Self-pay | Admitting: Family Medicine

## 2011-06-27 ENCOUNTER — Other Ambulatory Visit: Payer: Self-pay | Admitting: Family Medicine

## 2011-06-27 MED ORDER — CLOPIDOGREL BISULFATE 75 MG PO TABS: 75.0000 mg | ORAL_TABLET | Freq: Every day | ORAL | Status: AC

## 2011-07-22 LAB — DIFFERENTIAL
Lymphocytes Relative: 35
Monocytes Absolute: 0.5
Monocytes Relative: 5
Neutro Abs: 5.4

## 2011-07-22 LAB — COMPREHENSIVE METABOLIC PANEL
Albumin: 3.6
Alkaline Phosphatase: 120 — ABNORMAL HIGH
BUN: 7
Creatinine, Ser: 0.68
GFR calc Af Amer: >60
Potassium: 4.7
Total Protein: 6.4

## 2011-07-22 LAB — CBC
Hemoglobin: 14.4
RBC: 4.82
RDW: 12.9
WBC: 9.6

## 2011-09-14 ENCOUNTER — Encounter: Payer: Self-pay | Admitting: Family Medicine

## 2011-09-14 ENCOUNTER — Ambulatory Visit (INDEPENDENT_AMBULATORY_CARE_PROVIDER_SITE_OTHER): Payer: PRIVATE HEALTH INSURANCE | Admitting: Family Medicine

## 2011-09-14 VITALS — BP 120/74 | HR 91 | Temp 97.7°F | Ht 68.0 in | Wt 208.8 lb

## 2011-09-14 DIAGNOSIS — D509 Iron deficiency anemia, unspecified: Secondary | ICD-10-CM

## 2011-09-14 DIAGNOSIS — I2510 Atherosclerotic heart disease of native coronary artery without angina pectoris: Secondary | ICD-10-CM

## 2011-09-14 DIAGNOSIS — E785 Hyperlipidemia, unspecified: Secondary | ICD-10-CM

## 2011-09-14 DIAGNOSIS — I509 Heart failure, unspecified: Secondary | ICD-10-CM

## 2011-09-14 DIAGNOSIS — E119 Type 2 diabetes mellitus without complications: Secondary | ICD-10-CM

## 2011-09-14 DIAGNOSIS — D696 Thrombocytopenia, unspecified: Secondary | ICD-10-CM

## 2011-09-14 DIAGNOSIS — G20 Parkinson's disease: Secondary | ICD-10-CM

## 2011-09-14 DIAGNOSIS — Z23 Encounter for immunization: Secondary | ICD-10-CM

## 2011-09-14 DIAGNOSIS — J301 Allergic rhinitis due to pollen: Secondary | ICD-10-CM

## 2011-09-14 DIAGNOSIS — I10 Essential (primary) hypertension: Secondary | ICD-10-CM

## 2011-09-14 LAB — CBC WITH DIFFERENTIAL/PLATELET
Eosinophils Absolute: 0.1 10*3/uL (ref 0.0–0.7)
MCHC: 33.6 g/dL (ref 30.0–36.0)
MCV: 93.5 fl (ref 78.0–100.0)
Monocytes Absolute: 0.4 10*3/uL (ref 0.1–1.0)
Neutrophils Relative %: 68.5 % (ref 43.0–77.0)
Platelets: 139 10*3/uL — ABNORMAL LOW (ref 150.0–400.0)

## 2011-09-14 LAB — HEPATIC FUNCTION PANEL
Bilirubin, Direct: 0 mg/dL (ref 0.0–0.3)
Total Bilirubin: 0.6 mg/dL (ref 0.3–1.2)
Total Protein: 7.1 g/dL (ref 6.0–8.3)

## 2011-09-14 LAB — HEMOGLOBIN A1C: Hgb A1c MFr Bld: 7.3 % — ABNORMAL HIGH (ref 4.6–6.5)

## 2011-09-14 LAB — BASIC METABOLIC PANEL
BUN: 13 mg/dL (ref 6–23)
CO2: 28 mEq/L (ref 19–32)
Chloride: 105 mEq/L (ref 96–112)
Creatinine, Ser: 0.9 mg/dL (ref 0.4–1.5)

## 2011-09-14 MED ORDER — FLUTICASONE PROPIONATE 50 MCG/ACT NA SUSP: 2.0000 | Freq: Every day | NASAL | Status: AC

## 2011-09-14 NOTE — Patient Instructions (Signed)
Recheck 6 months

## 2011-09-14 NOTE — Progress Notes (Signed)
Patient Name: [name] Newman Date of Birth: Dec 01, 1940 Age: 70 y.o. Medical Record Number: [id number] Gender: male  History of Present Illness:  [name] Newman is a 70 y.o. very pleasant male patient who presents with the following:  Diabetes Mellitus: Tolerating Medications: Compliance with diet: +/- Exercise: none Avg blood sugars at home: "ok" Foot problems: none Hypoglycemia: none No nausea, vomitting, blurred vision, polyuria.  Lab Results  Component Value Date   HGBA1C 6.7* 03/10/2011    Basic Metabolic Panel:    Component Value Date/Time   NA 143 03/10/2011 1306   K 4.0 03/10/2011 1306   CL 107 03/10/2011 1306   CO2 31 03/10/2011 1306   BUN 13 03/10/2011 1306   CREATININE 0.7 03/10/2011 1306   GLUCOSE 144* 03/10/2011 1306   CALCIUM 9.2 03/10/2011 1306   HTN: Tolerating all medications: He was appearing somewhat somnolent, so his daughter stopped his ACE inhibitor, and then he seemed to perk up and has not had any of the somnolent episode since then Stable and at goal No CP, no sob. No HA.  BP Readings from Last 3 Encounters:  09/14/11 120/74  03/10/11 110/70  03/04/11 118/62   Lipids: Doing well, stable. Tolerating meds fine with no SE. Panel reviewed with patient.  Lipids:    Component Value Date/Time   CHOL  Value: 96        ATP III CLASSIFICATION:  <200     mg/dL   Desirable  956-213  mg/dL   Borderline High  >=086    mg/dL   High        5/78/4696 0515   TRIG 78 07/30/2010 0515   HDL 39* 07/30/2010 0515   LDLDIRECT 41.5 11/24/2010 1225   VLDL 16 07/30/2010 0515   CHOLHDL 2.5 07/30/2010 0515    Lab Results  Component Value Date   ALT 12 11/24/2010   AST 18 11/24/2010   ALKPHOS 83 11/24/2010   BILITOT 0.5 11/24/2010   Anemia: Asymptomatic. Has had some anemia over the last year. No bleeding. CBC:    Component Value Date/Time   WBC 5.5 03/10/2011 1306   HGB 12.0* 03/10/2011 1306   HCT 35.4* 03/10/2011 1306   PLT 104.0* 03/10/2011 1306   MCV 92.3  03/10/2011 1306   NEUTROABS 3.4 03/10/2011 1306   LYMPHSABS 1.7 03/10/2011 1306   MONOABS 0.3 03/10/2011 1306   EOSABS 0.1 03/10/2011 1306   BASOSABS 0.0 03/10/2011 1306   AR: Nose is stopped up. A couple of weeks.  Has used flonase - in the past  Past Medical History, Surgical History, Social History, Family History, and Problem List have been reviewed in EHR and updated if relevant.  Review of Systems:  GEN: No acute illnesses, no fevers, chills. GI: No n/v/d, eating normally Pulm: No SOB Interactive and getting along well at home.  Otherwise, ROS is as per the HPI.   Physical Examination: Filed Vitals:   09/14/11 1205  BP: 120/74  Pulse: 91  Temp: 97.7 F (36.5 C)  TempSrc: Oral  Height: 5\' 8"  (1.727 m)  Weight: 208 lb 12.8 oz (94.711 kg)  SpO2: 98%    GEN: WDWN, NAD, Non-toxic, A & O x 3 HEENT: Atraumatic, Normocephalic. Neck supple. No masses, No LAD. Ears and Nose: No external deformity. CV: RRR, 3/6 SEM. No JVD. No thrill. No extra heart sounds. PULM: CTA B, no wheezes, crackles, rhonchi. No retractions. No resp. distress. No accessory muscle use. EXTR: 1+ LE edema NEURO Normal gait.  PSYCH: Normally interactive. Conversant. Not depressed or anxious appearing.  Calm demeanor.    Assessment and Plan:  1. Allergic rhinitis due to pollen  fluticasone (FLONASE) 50 MCG/ACT nasal spray  2. DIABETES MELLITUS II, UNCOMPLICATED  Basic metabolic panel, Microalbumin / creatinine urine ratio, Hemoglobin A1c  3. CORONARY ARTERY DISEASE    4. Congestive heart failure, unspecified, asx, mild le edema   5. HYPERTENSION, BENIGN : seemed to be having symptomatic hypotension. Better now off ACE - there is a trade off with this in his CHF, but with age and immobility, I don't think that he should be hypotensive   6. HYPERLIPIDEMIA  Hepatic function panel, LDL cholesterol, direct  7. Thrombocytopenia  CBC with Differential  8. ANEMIA, IRON DEFICIENCY, UNSPEC.  CBC with Differential    9. Need for prophylactic vaccination and inoculation against influenza    10. PARKINSONISM : some visual hallucinations, more at night, but this has been improved since stopping sinemet, which suspect was the cause      Orders Placed This Encounter  Procedures  . Basic metabolic panel  . CBC with Differential  . Hepatic function panel  . LDL cholesterol, direct  . Microalbumin / creatinine urine ratio  . Hemoglobin A1c    Current Outpatient Prescriptions on File Prior to Visit  Medication Sig Dispense Refill  . albuterol (PROAIR HFA) 108 (90 BASE) MCG/ACT inhaler Inhale 2 puffs into the lungs every 6 (six) hours as needed.        . carvedilol (COREG) 6.25 MG tablet Take 1 tablet (6.25 mg total) by mouth 2 (two) times daily with a meal.  60 tablet  6  . clopidogrel (PLAVIX) 75 MG tablet Take 1 tablet (75 mg total) by mouth daily.  30 tablet  11  . CRESTOR 20 MG tablet TAKE 1 TABLET BY MOUTH AT BEDTIME  30 tablet  6  . furosemide (LASIX) 40 MG tablet Take 1 tablet (40 mg total) by mouth daily.  30 tablet  6  . glipiZIDE (GLUCOTROL) 5 MG 24 hr tablet TAKE 1 TABLET BY MOUTH ONCE A DAY  30 tablet  6  . lidocaine (LIDODERM) 5 % Place 1 patch onto the skin daily. Remove & Discard patch within 12 hours or as directed by MD       . metFORMIN (GLUCOPHAGE) 1000 MG tablet TAKE 1 TABLET BY MOUTH 2 TIMES A DAY  60 tablet  4  . pantoprazole (PROTONIX) 40 MG tablet Take 40 mg by mouth daily.        . pioglitazone (ACTOS) 45 MG tablet Take 1 tablet (45 mg total) by mouth daily.  30 tablet  11  . saxagliptin HCl (ONGLYZA) 5 MG TABS tablet Take 5 mg by mouth daily.        Marland Kitchen tiotropium (SPIRIVA) 18 MCG inhalation capsule Place 18 mcg into inhaler and inhale daily.          Medications Discontinued During This Encounter  Medication Reason  . Tamsulosin HCl (FLOMAX) 0.4 MG CAPS   . benazepril (LOTENSIN) 10 MG tablet   . carbidopa-levodopa (SINEMET) 25-100 MG per tablet

## 2011-09-15 ENCOUNTER — Other Ambulatory Visit: Payer: Self-pay

## 2011-09-15 LAB — MICROALBUMIN / CREATININE URINE RATIO
Creatinine,U: 96.5 mg/dL
Microalb, Ur: 13.2 mg/dL — ABNORMAL HIGH (ref 0.0–1.9)

## 2011-09-15 MED ORDER — SAXAGLIPTIN HCL 5 MG PO TABS: 5.0000 mg | ORAL_TABLET | Freq: Every day | ORAL | Status: DC

## 2011-09-15 NOTE — Telephone Encounter (Signed)
[facility] faxed refill request Onglyza 5 mg #30 x 6.

## 2011-09-21 ENCOUNTER — Other Ambulatory Visit: Payer: Self-pay | Admitting: Family Medicine

## 2011-09-30 IMAGING — CR DG SHOULDER 2+V*R*
4 series · 4 of 4 positions shown · non-contrast
Comparison: 01/09/2004.

CLINICAL DATA: Bilateral shoulder pain, right greater than left.

RIGHT SHOULDER - 2+ VIEW

[t shoulder ap internal righ]
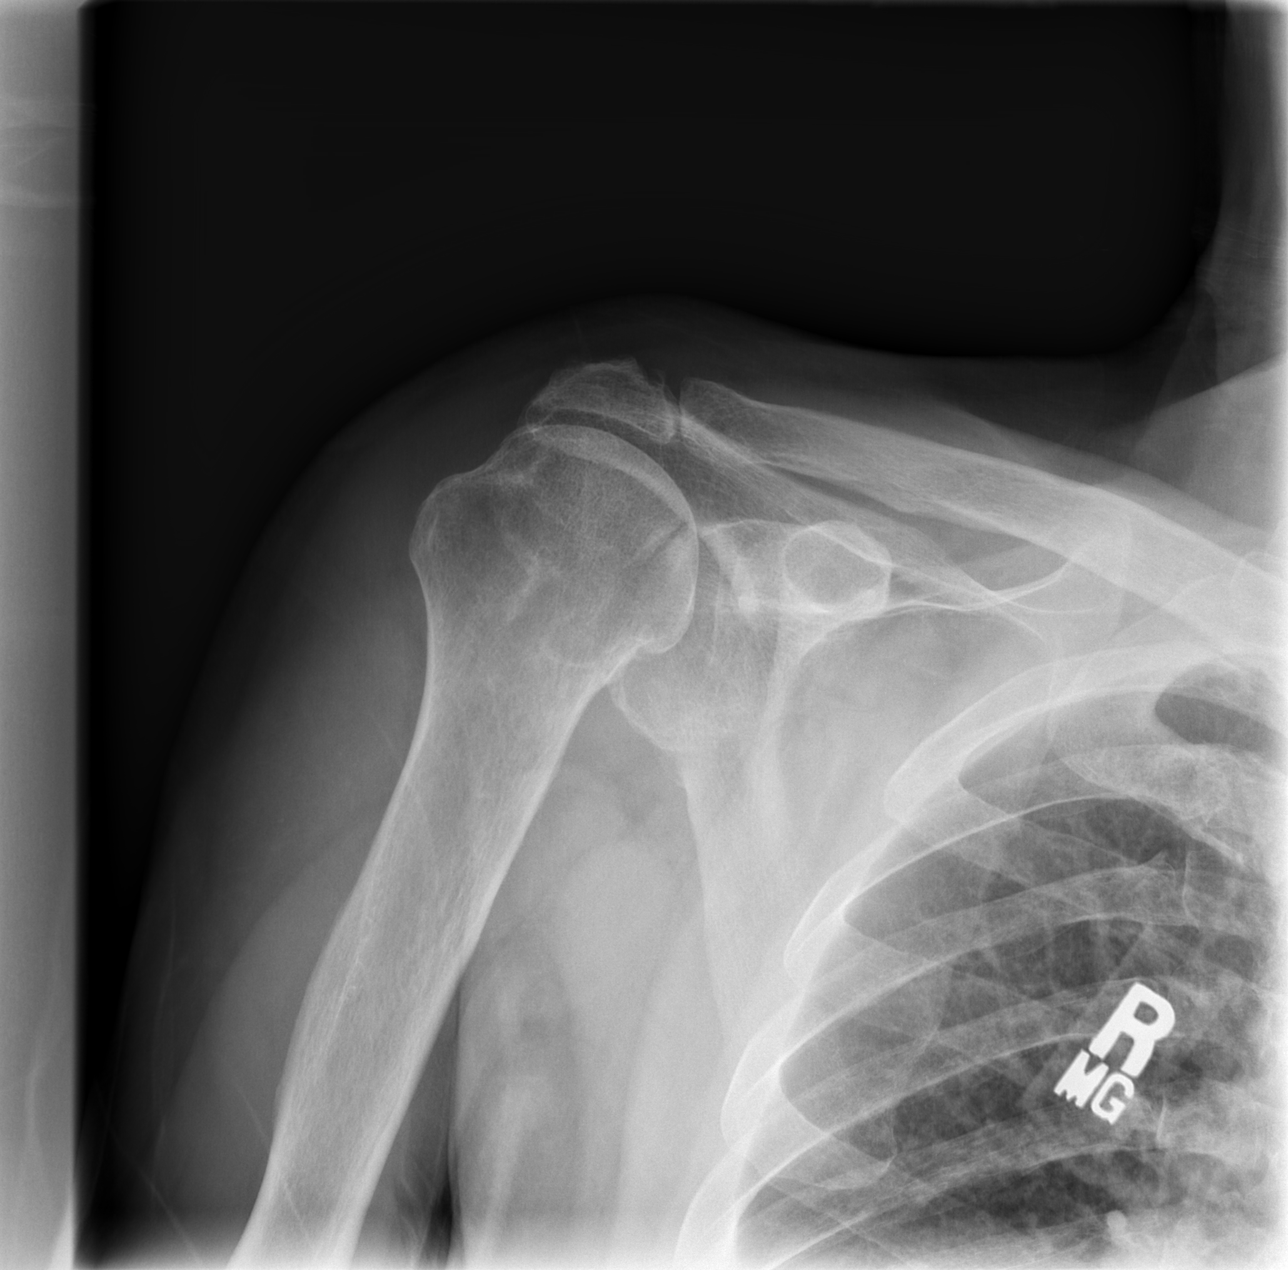

[t shoulder ap external righ]
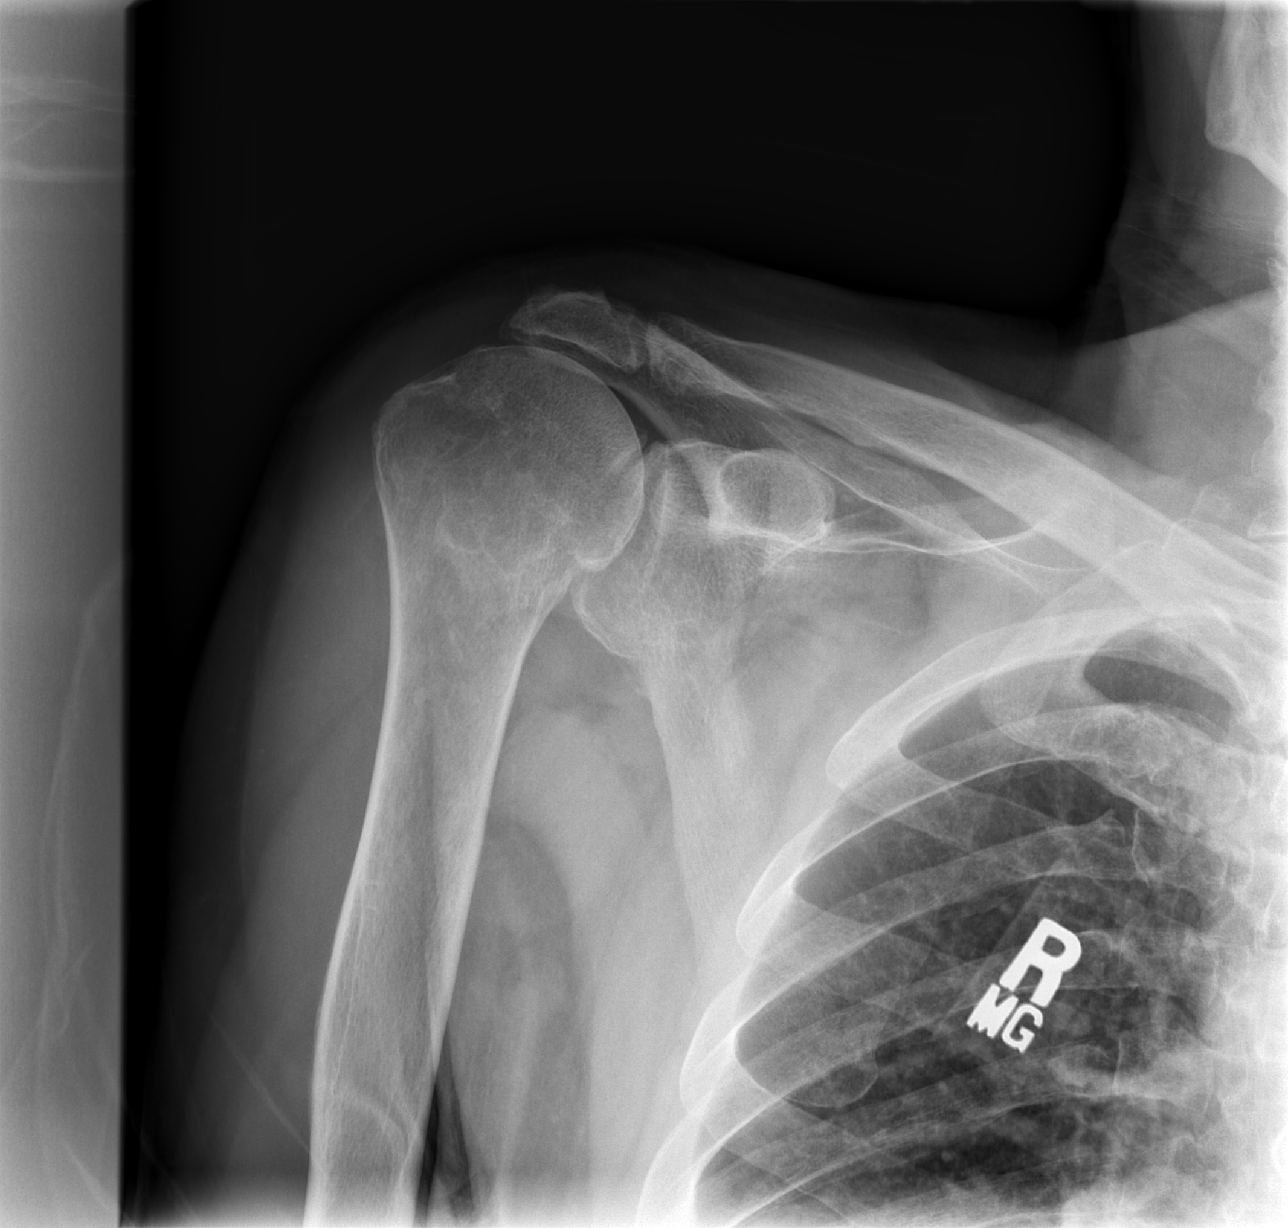

[t shoulder y view right (1 of 2)]
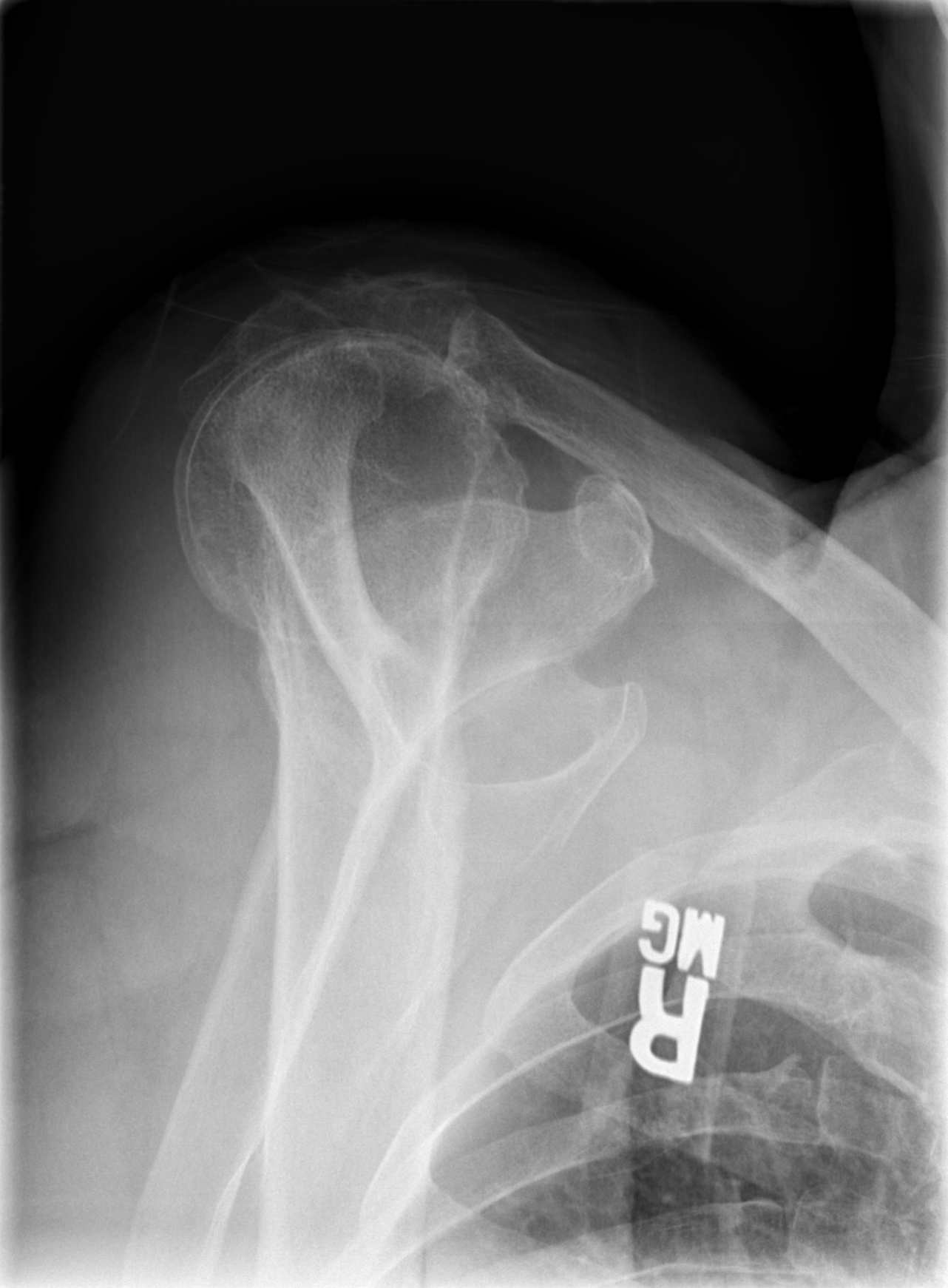

[t shoulder y view right (2 of 2)]
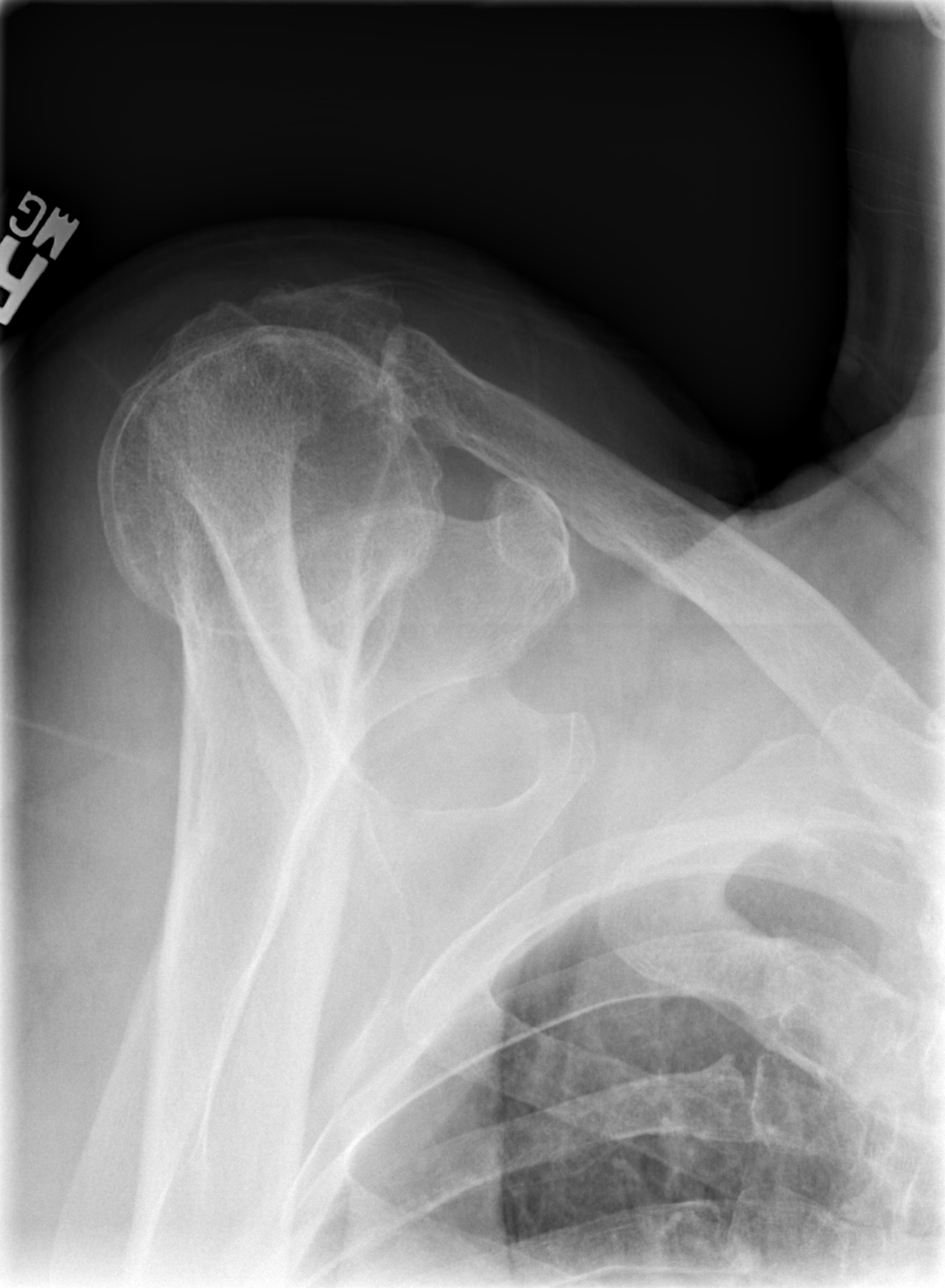

[4 of 4 positions shown; findings below may reference images not displayed]

FINDINGS: Today's left shoulder.  Progressive degenerative changes
in the right shoulder.  There is loss of subacromial space, likely
related to rotator cuff disease. No acute bony abnormality.
Specifically, no fracture, subluxation, or dislocation.  Soft
tissues are intact.
IMPRESSION: Degenerative changes in the right shoulder with probable rotator
cuff disease, progressed since prior study.

No acute bony abnormality.

## 2011-09-30 IMAGING — CR DG FOOT COMPLETE 3+V*R*
3 series · 3 of 3 positions shown · non-contrast
Comparison: None.

CLINICAL DATA: Injury, pain in the third and fourth metatarsal
region.

RIGHT FOOT COMPLETE - 3+ VIEW

[t foot ap right]
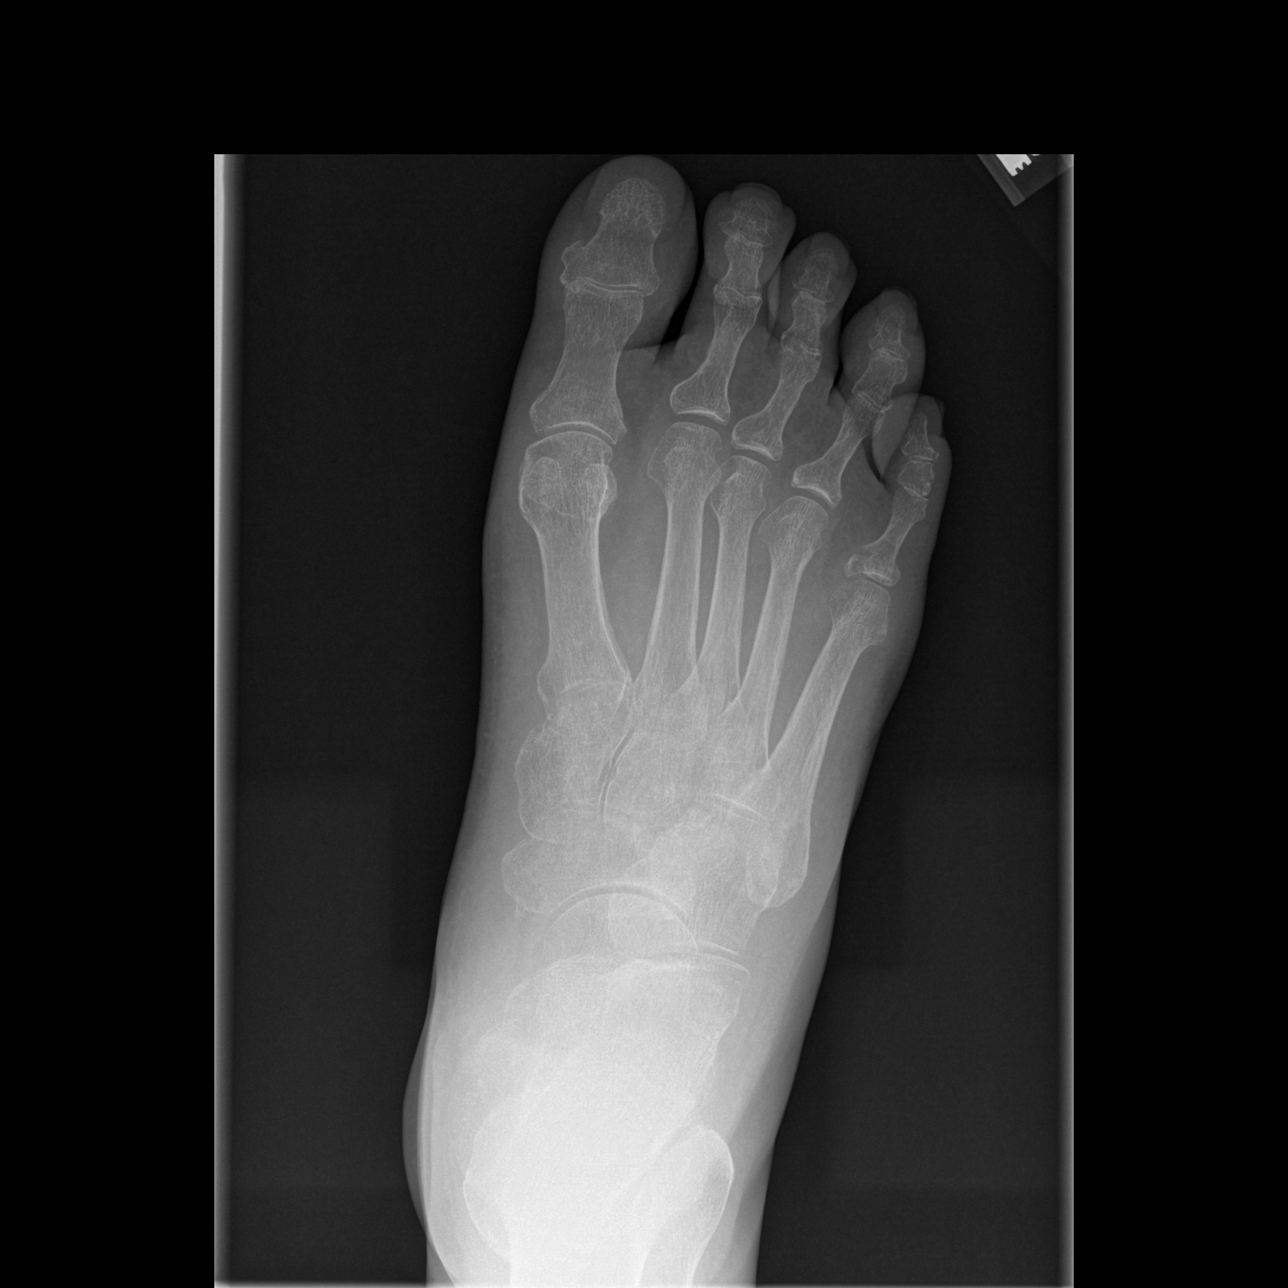

[t foot oblique right]
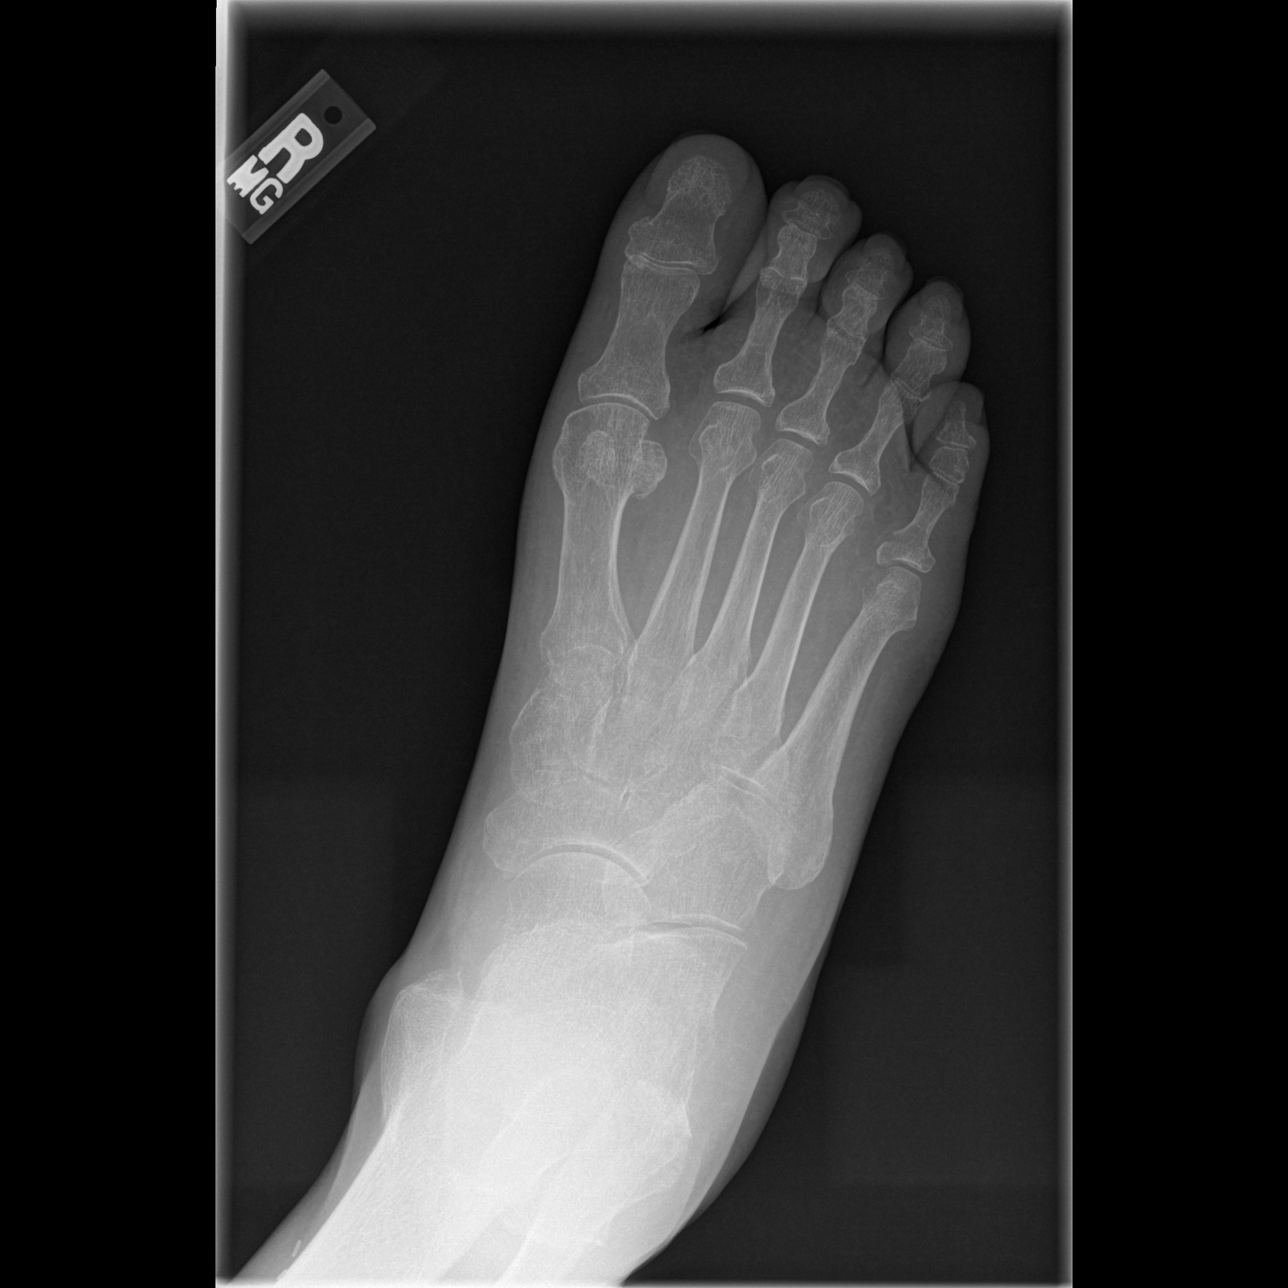

[t foot lat right]
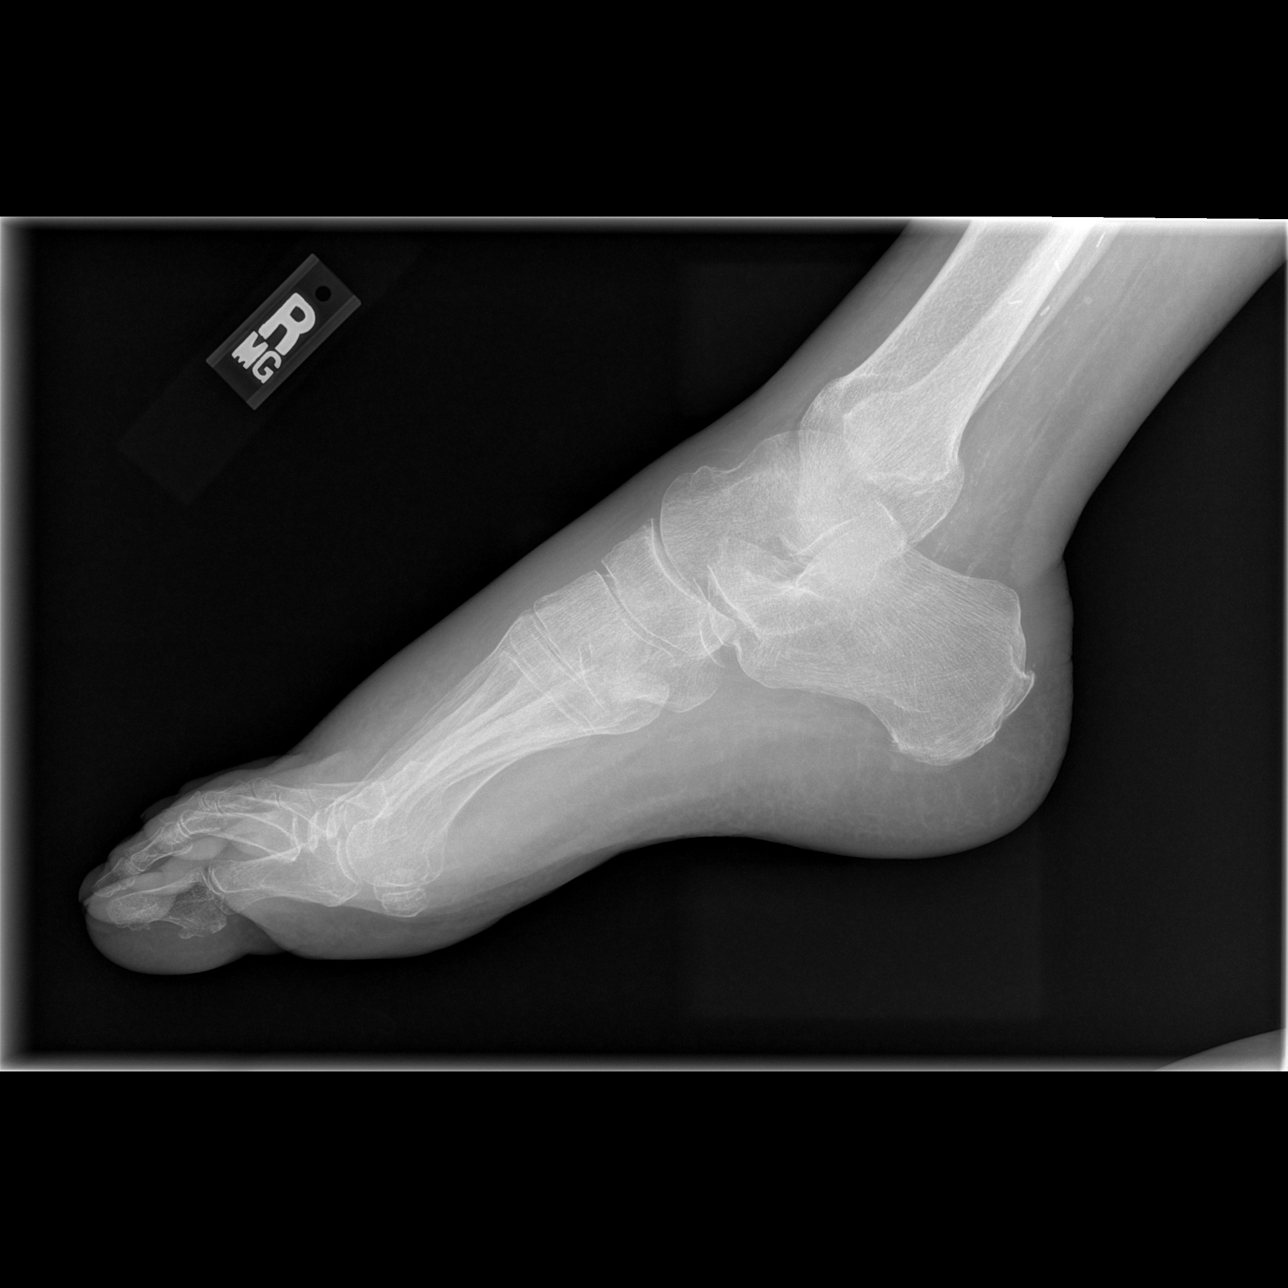

[3 of 3 positions shown; findings below may reference images not displayed]

FINDINGS: There is a fracture through the proximal aspect of the
proximal fifth phalanx.  No additional acute bony abnormality.
Fracture likely extends into the fifth MTP joint.

There is mild osteopenia.  Soft tissue swelling noted along the
dorsum of the foot.
IMPRESSION: Fracture through the base of the right fifth proximal phalanx with
intra-articular extension.

## 2011-10-11 ENCOUNTER — Ambulatory Visit (INDEPENDENT_AMBULATORY_CARE_PROVIDER_SITE_OTHER): Payer: PRIVATE HEALTH INSURANCE | Admitting: Family Medicine

## 2011-10-11 ENCOUNTER — Ambulatory Visit
Admission: RE | Admit: 2011-10-11 | Discharge: 2011-10-11 | Disposition: A | Discharge status: Home / Self Care | Payer: PRIVATE HEALTH INSURANCE | Source: Ambulatory Visit | Attending: Family Medicine | Admitting: Family Medicine

## 2011-10-11 ENCOUNTER — Encounter: Payer: Self-pay | Admitting: Family Medicine

## 2011-10-11 VITALS — BP 118/64 | HR 76 | Temp 97.9°F | Wt 202.2 lb

## 2011-10-11 DIAGNOSIS — S99929A Unspecified injury of unspecified foot, initial encounter: Secondary | ICD-10-CM

## 2011-10-11 DIAGNOSIS — M79605 Pain in left leg: Secondary | ICD-10-CM | POA: Insufficient documentation

## 2011-10-11 DIAGNOSIS — M79604 Pain in right leg: Secondary | ICD-10-CM

## 2011-10-11 DIAGNOSIS — S8990XA Unspecified injury of unspecified lower leg, initial encounter: Secondary | ICD-10-CM

## 2011-10-11 DIAGNOSIS — M79609 Pain in unspecified limb: Secondary | ICD-10-CM

## 2011-10-11 NOTE — Patient Instructions (Addendum)
I think you hurt your toe nail.  It may fall off We will call you with referral for foot doctor and for blood pressures of both legs. Let us know if any worsening.

## 2011-10-11 NOTE — Progress Notes (Signed)
Subjective:    Patient ID: [name] Newman, male    DOB: 04/20/1941, 70 y.o.   MRN: [id number]  HPI CC: toes turning purple  70 yo with h/o HTN, HLD, DM, CVA, CAD s/p MI, dementia and parkinson's presents with caregiver ([place]) with 2wk h/o foot complaint.  One toe starting to turn purple.  When in hoveround power wheelchair, stubs toes onto wall.  Hit left big toe last Monday (8d ago).  Right 3rd toe started turning purple last week.    Uses brace on right ankle.  Has diabetic shoes on today.  Endorses bilateral leg pain front and back.  Stays in wheelchair, no ambulation.  Denies fevers/chills, nausea/vomiting, spreading redness.  States diabetes doing well, fasting sugar this am 125. Lab Results  Component Value Date   HGBA1C 7.3* 09/14/2011   Medications and allergies reviewed and updated in chart.  Past histories reviewed and updated if relevant as below. Patient Active Problem List  Diagnoses  . CARCINOMA, BASAL CELL  . DIABETES MELLITUS II, UNCOMPLICATED  . DIABETIC PERIPHERAL NEUROPATHY  . HYPERLIPIDEMIA  . ANEMIA, IRON DEFICIENCY, UNSPEC.  Marland Kitchen ANXIETY  . HYPERTENSION, BENIGN  . CORONARY ARTERY DISEASE  . Congestive heart failure, unspecified  . COPD  . GASTROESOPHAGEAL REFLUX, NO ESOPHAGITIS  . URINARY INCONTINENCE, MALE  . CEREBROVASCULAR ACCIDENT, HX OF  . NEPHROLITHIASIS, HX OF  . PARKINSONISM  . Thrombocytopenia   Past Medical History  Diagnosis Date  . Diabetes mellitus   . Hypertension   . Coronary artery disease     status post MI in 2003 followed by coronary artery bypass grafting in January 2003.  The patient has a LIMA to the LAD, sequential saphenous vein graft to the second and third obtuse marginals, saphenous vein graft to the first diagonal, and a sequential saphenous vein graft to the PDA and an acute marginal.  Ad myoview 6/10 showed EF 39%, inferior/septal/inferolateral hypokinesis and scar, no isc  . MI (myocardial infarction)   . CHF  (congestive heart failure)   . Normal pressure hydrocephalus   . Hyperlipidemia   . CVA (cerebrovascular accident) 07/2001  . Low back pain     s/p lumbar surgery x 4  . GERD (gastroesophageal reflux disease)   . Parkinson's disease   . Nephrolithiasis   . Dementia   . Anemia   . Aortic stenosis   . COPD (chronic obstructive pulmonary disease)    Past Surgical History  Procedure Date  . Coronary artery bypass graft 10/31/2001    3 vessel CABG  . Spine surgery     x 4  . Cardiac catheterization   . Microdiscectomy lumbar    History  Substance Use Topics  . Smoking status: Former Smoker -- 4.0 packs/day for 35 years    Types: Cigarettes    Quit date: 10/31/1988  . Smokeless tobacco: Never Used  . Alcohol Use: No   Family History  Problem Relation Age of Onset  . Heart disease Mother   . Emphysema Father   . Emphysema Sister    Allergies  Allergen Reactions  . Atorvastatin     REACTION: unspecified   Current Outpatient Prescriptions on File Prior to Visit  Medication Sig Dispense Refill  . carvedilol (COREG) 6.25 MG tablet Take 1 tablet (6.25 mg total) by mouth 2 (two) times daily with a meal.  60 tablet  6  . clopidogrel (PLAVIX) 75 MG tablet Take 1 tablet (75 mg total) by mouth daily.  30 tablet  11  . CRESTOR 20 MG tablet TAKE 1 TABLET BY MOUTH AT BEDTIME  30 tablet  6  . fluticasone (FLONASE) 50 MCG/ACT nasal spray Place 2 sprays into the nose daily.  16 g  12  . furosemide (LASIX) 40 MG tablet Take 1 tablet (40 mg total) by mouth daily.  30 tablet  6  . glipiZIDE (GLUCOTROL) 5 MG 24 hr tablet TAKE 1 TABLET BY MOUTH ONCE A DAY  30 tablet  6  . lidocaine (LIDODERM) 5 % Place 1 patch onto the skin daily. Remove & Discard patch within 12 hours or as directed by MD       . metFORMIN (GLUCOPHAGE) 1000 MG tablet TAKE 1 TABLET BY MOUTH 2 TIMES A DAY  60 tablet  4  . metFORMIN (GLUCOPHAGE) 1000 MG tablet TAKE 1 TABLET BY MOUTH 2 TIMES A DAY  60 tablet  4  . oxybutynin  (DITROPAN-XL) 5 MG 24 hr tablet Take 5 mg by mouth daily.        . pantoprazole (PROTONIX) 40 MG tablet Take 40 mg by mouth daily.        . pioglitazone (ACTOS) 45 MG tablet Take 1 tablet (45 mg total) by mouth daily.  30 tablet  11  . saxagliptin HCl (ONGLYZA) 5 MG TABS tablet Take 1 tablet (5 mg total) by mouth daily.  30 tablet  6  . albuterol (PROAIR HFA) 108 (90 BASE) MCG/ACT inhaler Inhale 2 puffs into the lungs every 6 (six) hours as needed.        . tiotropium (SPIRIVA) 18 MCG inhalation capsule Place 18 mcg into inhaler and inhale daily.         Review of Systems Per HPI    Objective:   Physical Exam  Nursing note and vitals reviewed. Cardiovascular:  Pulses:      Dorsalis pedis pulses are 2+ on the right side, and 2+ on the left side.       Posterior tibial pulses are 1+ on the right side, and 1+ on the left side.  Musculoskeletal:       Feet:       foot exam: No skin breakdown No calluses  Normal sensation to light tough Nails long, some onychomycosis big toe R  Toes neurovascularly intact, sensation intact.  No calf pain, no palp cords  2+ pitting LE edema on right, tr on left       Assessment & Plan:  When obtaining xray, pt got agitated because felt that labtech was rushing him.  Shouting used explicatives to insult labtech.  I advised it was not appropriate to yell at staff and use profane language and needed to calm down before we could do further evaluation.  Pt unable to stay calm so was sent home.

## 2011-10-11 NOTE — Assessment & Plan Note (Addendum)
Anticipate nailbed avulsion injury after stubbed toe with resulting hematoma of right 4th toe.  Injury happened at least 6d ago. No evidence of ischemic extremity today.  2+ DP bilaterally. Wanted to obtain xrays to r/o digit fx - see above Refer to podiatry for further eval/treatment.

## 2011-10-11 NOTE — Assessment & Plan Note (Signed)
In h/o CVA, CAD/MI, will obtain ABIs. Update [place] if worsening. For leg swelling, advised elevation of legs. VSS.

## 2011-10-18 DIAGNOSIS — Z0279 Encounter for issue of other medical certificate: Secondary | ICD-10-CM

## 2011-11-08 ENCOUNTER — Other Ambulatory Visit: Payer: Self-pay | Admitting: Cardiovascular Disease

## 2011-11-08 NOTE — Telephone Encounter (Signed)
Refill sent for furosemide  

## 2011-11-09 ENCOUNTER — Telehealth: Payer: Self-pay | Admitting: Radiology

## 2011-11-09 ENCOUNTER — Telehealth: Payer: Self-pay

## 2011-11-09 MED ORDER — FUROSEMIDE 40 MG PO TABS: 40.0000 mg | ORAL_TABLET | Freq: Every day | ORAL | Status: DC

## 2011-11-09 NOTE — Telephone Encounter (Signed)
[facility] notified us that this patient never returned his ifob test. I called the patient, he didn't do the test.  I told him we would document that in his chart.

## 2011-11-09 NOTE — Telephone Encounter (Signed)
Refill sent for furosemide 40 mg take one tablet.

## 2011-11-13 NOTE — Telephone Encounter (Signed)
Noted - we can give again at next OV or he can decline again.

## 2011-11-18 ENCOUNTER — Other Ambulatory Visit: Payer: Self-pay | Admitting: Family Medicine

## 2011-12-15 ENCOUNTER — Telehealth: Payer: Self-pay

## 2011-12-15 ENCOUNTER — Other Ambulatory Visit: Payer: Self-pay | Admitting: Cardiology

## 2011-12-15 ENCOUNTER — Other Ambulatory Visit: Payer: Self-pay | Admitting: Family Medicine

## 2011-12-15 MED ORDER — CARVEDILOL 6.25 MG PO TABS: 6.2500 mg | ORAL_TABLET | Freq: Two times a day (BID) | ORAL | Status: DC

## 2011-12-15 NOTE — Telephone Encounter (Signed)
Refill sent for carvedilol 6.25 mg take one tablet twice a day.  

## 2011-12-22 ENCOUNTER — Telehealth: Payer: Self-pay | Admitting: *Deleted

## 2011-12-22 NOTE — Telephone Encounter (Signed)
Patient daughter is asking for an order for a hospital bed to be faxed to [facility] at [phone]

## 2011-12-22 NOTE — Telephone Encounter (Signed)
rx faxed to [facility]  

## 2011-12-22 NOTE — Telephone Encounter (Signed)
done

## 2012-01-04 ENCOUNTER — Other Ambulatory Visit: Payer: Self-pay | Admitting: Family Medicine

## 2012-01-04 ENCOUNTER — Other Ambulatory Visit: Payer: Self-pay | Admitting: Cardiology

## 2012-01-19 ENCOUNTER — Telehealth: Payer: Self-pay | Admitting: *Deleted

## 2012-01-19 NOTE — Telephone Encounter (Signed)
Patient daughter requesting order for hoyer lift be faxed over to [facility]

## 2012-01-21 ENCOUNTER — Other Ambulatory Visit: Payer: Self-pay | Admitting: Family Medicine

## 2012-01-24 NOTE — Telephone Encounter (Signed)
Can you let Tammy know I am working on it - have never ordered a hoyer lift before, so I want to make we do it right

## 2012-01-25 ENCOUNTER — Other Ambulatory Visit: Payer: Self-pay | Admitting: Family Medicine

## 2012-01-25 NOTE — Telephone Encounter (Signed)
Handwritten script.

## 2012-01-26 NOTE — Telephone Encounter (Signed)
Faxed to [facility]  

## 2012-03-02 ENCOUNTER — Other Ambulatory Visit: Payer: Self-pay | Admitting: Family Medicine

## 2012-03-09 ENCOUNTER — Other Ambulatory Visit: Payer: Self-pay | Admitting: *Deleted

## 2012-03-09 MED ORDER — PIOGLITAZONE HCL 45 MG PO TABS: 45.0000 mg | ORAL_TABLET | Freq: Every day | ORAL | Status: DC

## 2012-03-14 ENCOUNTER — Ambulatory Visit: Payer: PRIVATE HEALTH INSURANCE | Admitting: Family Medicine

## 2012-03-14 DIAGNOSIS — Z0289 Encounter for other administrative examinations: Secondary | ICD-10-CM

## 2012-03-21 ENCOUNTER — Ambulatory Visit (INDEPENDENT_AMBULATORY_CARE_PROVIDER_SITE_OTHER): Payer: PRIVATE HEALTH INSURANCE | Admitting: Family Medicine

## 2012-03-21 ENCOUNTER — Encounter: Payer: Self-pay | Admitting: Family Medicine

## 2012-03-21 VITALS — BP 120/76 | HR 78 | Temp 97.9°F | Ht 68.0 in | Wt 195.4 lb

## 2012-03-21 DIAGNOSIS — I2510 Atherosclerotic heart disease of native coronary artery without angina pectoris: Secondary | ICD-10-CM

## 2012-03-21 DIAGNOSIS — E1149 Type 2 diabetes mellitus with other diabetic neurological complication: Secondary | ICD-10-CM

## 2012-03-21 DIAGNOSIS — I509 Heart failure, unspecified: Secondary | ICD-10-CM

## 2012-03-21 DIAGNOSIS — R0989 Other specified symptoms and signs involving the circulatory and respiratory systems: Secondary | ICD-10-CM

## 2012-03-21 DIAGNOSIS — R5381 Other malaise: Secondary | ICD-10-CM

## 2012-03-21 DIAGNOSIS — D696 Thrombocytopenia, unspecified: Secondary | ICD-10-CM

## 2012-03-21 DIAGNOSIS — Z8679 Personal history of other diseases of the circulatory system: Secondary | ICD-10-CM

## 2012-03-21 DIAGNOSIS — R609 Edema, unspecified: Secondary | ICD-10-CM

## 2012-03-21 DIAGNOSIS — IMO0001 Reserved for inherently not codable concepts without codable children: Secondary | ICD-10-CM

## 2012-03-21 DIAGNOSIS — E785 Hyperlipidemia, unspecified: Secondary | ICD-10-CM

## 2012-03-21 DIAGNOSIS — Z79899 Other long term (current) drug therapy: Secondary | ICD-10-CM

## 2012-03-21 DIAGNOSIS — J449 Chronic obstructive pulmonary disease, unspecified: Secondary | ICD-10-CM

## 2012-03-21 DIAGNOSIS — J4489 Other specified chronic obstructive pulmonary disease: Secondary | ICD-10-CM

## 2012-03-21 DIAGNOSIS — E1142 Type 2 diabetes mellitus with diabetic polyneuropathy: Secondary | ICD-10-CM

## 2012-03-21 DIAGNOSIS — I10 Essential (primary) hypertension: Secondary | ICD-10-CM

## 2012-03-21 DIAGNOSIS — R0609 Other forms of dyspnea: Secondary | ICD-10-CM

## 2012-03-21 DIAGNOSIS — E119 Type 2 diabetes mellitus without complications: Secondary | ICD-10-CM

## 2012-03-21 DIAGNOSIS — R0600 Dyspnea, unspecified: Secondary | ICD-10-CM

## 2012-03-21 LAB — BASIC METABOLIC PANEL
BUN: 14 mg/dL (ref 6–23)
CO2: 30 mEq/L (ref 19–32)
Calcium: 9.3 mg/dL (ref 8.4–10.5)
Creatinine, Ser: 1 mg/dL (ref 0.4–1.5)

## 2012-03-21 LAB — CBC WITH DIFFERENTIAL/PLATELET
Basophils Absolute: 0 10*3/uL (ref 0.0–0.1)
Basophils Relative: 0.4 % (ref 0.0–3.0)
Eosinophils Absolute: 0.1 10*3/uL (ref 0.0–0.7)
Lymphocytes Relative: 25.5 % (ref 12.0–46.0)
MCHC: 33.3 g/dL (ref 30.0–36.0)
Neutrophils Relative %: 67.8 % (ref 43.0–77.0)
Platelets: 118 10*3/uL — ABNORMAL LOW (ref 150.0–400.0)
RBC: 4.02 Mil/uL — ABNORMAL LOW (ref 4.22–5.81)
RDW: 16.7 % — ABNORMAL HIGH (ref 11.5–14.6)

## 2012-03-21 LAB — BRAIN NATRIURETIC PEPTIDE: Pro B Natriuretic peptide (BNP): 227 pg/mL — ABNORMAL HIGH (ref 0.0–100.0)

## 2012-03-21 LAB — MICROALBUMIN / CREATININE URINE RATIO
Creatinine,U: 137 mg/dL
Microalb Creat Ratio: 5.8 mg/g (ref 0.0–30.0)

## 2012-03-21 LAB — HEPATIC FUNCTION PANEL
Alkaline Phosphatase: 80 U/L (ref 39–117)
Bilirubin, Direct: 0 mg/dL (ref 0.0–0.3)
Total Bilirubin: 0.5 mg/dL (ref 0.3–1.2)

## 2012-03-21 LAB — LDL CHOLESTEROL, DIRECT: Direct LDL: 40.8 mg/dL

## 2012-03-21 LAB — TSH: TSH: 0.79 u[IU]/mL (ref 0.35–5.50)

## 2012-03-21 NOTE — Patient Instructions (Signed)
F/u with me in 3 months  REFERRAL: GO THE THE FRONT ROOM AT THE ENTRANCE OF OUR CLINIC, NEAR CHECK IN. ASK FOR MARION. SHE WILL HELP YOU SET UP YOUR REFERRAL. DATE: TIME:  F/u with Dr. Mariah Milling the heart doctor, also

## 2012-03-23 ENCOUNTER — Other Ambulatory Visit: Payer: Self-pay | Admitting: *Deleted

## 2012-03-23 NOTE — Telephone Encounter (Signed)
Medication updated

## 2012-03-23 NOTE — Progress Notes (Signed)
Patient Name: [name] Newman Date of Birth: May 22, 1941 Medical Record Number: [id number] Gender: male Date of Encounter: 03/21/2012  History of Present Illness:  [name] Newman is a 71 y.o. very pleasant male patient who presents with the following:   Patient presents with a chief complaint of worsening lower extremity edema. Actually has missed his routine followup appointment a few weeks ago, and has multiple medical comorbidities and problems that need to be addressed and have routine followup as well.  Lower extremity edema, worsened in the last few weeks. No known cause. No significant alteration in diet or worsening salt intake. He is having some mild shortness of breath but none at this very significant. He does not stress his body much and is mostly confined to wheelchair. Reports compliance with medication. Have given them some occasional extra Lasix dose which has helped. He does take 40 mg of Lasix most mornings. He has had some increased fluid intake.  Diabetes Mellitus: Tolerating Medications: yes Compliance with diet: fair to poor Exercise: none Avg blood sugars at home: normal Foot problems: no breakdown, tingling + Hypoglycemia: none No nausea, vomitting, blurred vision, polyuria.  Lab Results  Component Value Date   HGBA1C 6.8* 03/21/2012    Wt Readings from Last 3 Encounters:  03/21/12 195 lb 6.4 oz (88.633 kg)  10/11/11 202 lb 4 oz (91.74 kg)  09/14/11 208 lb 12.8 oz (94.711 kg)    Body mass index is 29.71 kg/(m^2).   HTN: Tolerating all medications without side effects Stable and at goal No CP, no sob. No HA.  BP Readings from Last 3 Encounters:  03/21/12 120/76  10/11/11 118/64  09/14/11 120/74    Basic Metabolic Panel:    Component Value Date/Time   NA 142 03/21/2012 1336   K 4.0 03/21/2012 1336   CL 105 03/21/2012 1336   CO2 30 03/21/2012 1336   BUN 14 03/21/2012 1336   CREATININE 1.0 03/21/2012 1336   GLUCOSE 157* 03/21/2012 1336   CALCIUM  9.3 03/21/2012 1336   Report compliance with taking his CAD meds and COPD inhalers. No chest pain.  F/u CBC for thrombocytopenia  CBC:    Component Value Date/Time   WBC 6.1 03/21/2012 1336   HGB 12.2* 03/21/2012 1336   HCT 36.7* 03/21/2012 1336   PLT 118.0* 03/21/2012 1336   MCV 91.3 03/21/2012 1336   NEUTROABS 4.1 03/21/2012 1336   LYMPHSABS 1.5 03/21/2012 1336   MONOABS 0.3 03/21/2012 1336   EOSABS 0.1 03/21/2012 1336   BASOSABS 0.0 03/21/2012 1336     Patient Active Problem List  Diagnoses  . CARCINOMA, BASAL CELL  . DIABETES MELLITUS II, UNCOMPLICATED  . DIABETIC PERIPHERAL NEUROPATHY  . HYPERLIPIDEMIA  . ANEMIA, IRON DEFICIENCY, UNSPEC.  Marland Kitchen ANXIETY  . HYPERTENSION, BENIGN  . CORONARY ARTERY DISEASE  . Congestive heart failure, unspecified  . COPD  . GASTROESOPHAGEAL REFLUX, NO ESOPHAGITIS  . URINARY INCONTINENCE, MALE  . CEREBROVASCULAR ACCIDENT, HX OF  . NEPHROLITHIASIS, HX OF  . PARKINSONISM  . Thrombocytopenia   Past Medical History  Diagnosis Date  . Diabetes mellitus   . Hypertension   . Coronary artery disease     status post MI in 2003 followed by coronary artery bypass grafting in January 2003.  The patient has a LIMA to the LAD, sequential saphenous vein graft to the second and third obtuse marginals, saphenous vein graft to the first diagonal, and a sequential saphenous vein graft to the PDA and an acute marginal.  Ad myoview 6/10 showed EF 39%, inferior/septal/inferolateral hypokinesis and scar, no isc  . MI (myocardial infarction)   . CHF (congestive heart failure)   . Normal pressure hydrocephalus   . Hyperlipidemia   . CVA (cerebrovascular accident) 07/2001  . Low back pain     s/p lumbar surgery x 4  . GERD (gastroesophageal reflux disease)   . Parkinson's disease   . Nephrolithiasis   . Dementia   . Anemia   . Aortic stenosis   . COPD (chronic obstructive pulmonary disease)    Past Surgical History  Procedure Date  . Coronary artery bypass graft  10/31/2001    3 vessel CABG  . Spine surgery     x 4  . Cardiac catheterization   . Microdiscectomy lumbar    History  Substance Use Topics  . Smoking status: Former Smoker -- 4.0 packs/day for 35 years    Types: Cigarettes    Quit date: 10/31/1988  . Smokeless tobacco: Never Used  . Alcohol Use: No   Family History  Problem Relation Age of Onset  . Heart disease Mother   . Emphysema Father   . Emphysema Sister    Allergies  Allergen Reactions  . Atorvastatin     REACTION: unspecified    Medication list has been reviewed and updated.  Review of Systems: As above, eating and drinking ok. No fever, chills, sweats, ulcers on feet.  Physical Examination: Filed Vitals:   03/21/12 1201  BP: 120/76  Pulse: 78  Temp: 97.9 F (36.6 C)  TempSrc: Oral  Height: 5\' 8"  (1.727 m)  Weight: 195 lb 6.4 oz (88.633 kg)  SpO2: 98%    Body mass index is 29.71 kg/(m^2).   GEN: WDWN, NAD, Non-toxic, A & O x 3 HEENT: Atraumatic, Normocephalic. Neck supple. No masses, No LAD. Ears and Nose: No external deformity. CV: RRR, 3/6 SEM. No JVD. No thrill. No extra heart sounds. PULM: CTA B, no wheezes, crackles, rhonchi. No retractions. No resp. distress. No accessory muscle use. EXTR: No c/c. 2+ pedal edema, tr LE edema. PSYCH: Normally interactive. Conversant. Not depressed or anxious appearing.  Calm demeanor.     Diabetic foot exam: Normal inspection No skin breakdown No calluses  Normal DP pulses Normal sensation to light tough Nails normal "tingling"  Assessment and Plan: 1. DIABETES MELLITUS II, UNCOMPLICATED  Basic metabolic panel, Hemoglobin A1c, Microalbumin / creatinine urine ratio  2. CORONARY ARTERY DISEASE  Ambulatory referral to Cardiology  3. Congestive heart failure, unspecified  Brain natriuretic peptide, Ambulatory referral to Cardiology  4. COPD    5. CEREBROVASCULAR ACCIDENT, HX OF    6. HYPERTENSION, BENIGN    7. Thrombocytopenia  CBC with Differential    8. DIABETIC PERIPHERAL NEUROPATHY    9. Dyspnea  Brain natriuretic peptide  10. Edema, peripheral  Brain natriuretic peptide, Hepatic function panel  11. Other malaise and fatigue  TSH  12. Encounter for long-term (current) use of other medications  Hepatic function panel  13. Other and unspecified hyperlipidemia  LDL cholesterol, direct    Labs all appear to be essentially stable.  I suspected his lower extremity edema is likely from some worsening heart failure. I did increase his Lasix dosing to 40 mg in the morning and 20 mg with lunch. Instructed his daughter to hold 20 mg if feels dehydrated or if no swelling at all. Significant murmur and HF. He is overdue for his Cardiology f/u - they requested that they make a f/u with  Dr. Mariah Milling themselves. Reasonable.  DM and other problems have stabilized.  Results for orders placed in visit on 03/21/12  BRAIN NATRIURETIC PEPTIDE      Component Value Range   Pro B Natriuretic peptide (BNP) 227.0 (*) 0.0 - 100.0 (pg/mL)  BASIC METABOLIC PANEL      Component Value Range   Sodium 142  135 - 145 (mEq/L)   Potassium 4.0  3.5 - 5.1 (mEq/L)   Chloride 105  96 - 112 (mEq/L)   CO2 30  19 - 32 (mEq/L)   Glucose, Bld 157 (*) 70 - 99 (mg/dL)   BUN 14  6 - 23 (mg/dL)   Creatinine, Ser 1.0  0.4 - 1.5 (mg/dL)   Calcium 9.3  8.4 - 16.1 (mg/dL)   GFR 09.60  >45.40 (mL/min)  CBC WITH DIFFERENTIAL      Component Value Range   WBC 6.1  4.5 - 10.5 (K/uL)   RBC 4.02 (*) 4.22 - 5.81 (Mil/uL)   Hemoglobin 12.2 (*) 13.0 - 17.0 (g/dL)   HCT 98.1 (*) 19.1 - 52.0 (%)   MCV 91.3  78.0 - 100.0 (fl)   MCHC 33.3  30.0 - 36.0 (g/dL)   RDW 47.8 (*) 29.5 - 14.6 (%)   Platelets 118.0 (*) 150.0 - 400.0 (K/uL)   Neutrophils Relative 67.8  43.0 - 77.0 (%)   Lymphocytes Relative 25.5  12.0 - 46.0 (%)   Monocytes Relative 4.4  3.0 - 12.0 (%)   Eosinophils Relative 1.9  0.0 - 5.0 (%)   Basophils Relative 0.4  0.0 - 3.0 (%)   Neutro Abs 4.1  1.4 - 7.7 (K/uL)   Lymphs  Abs 1.5  0.7 - 4.0 (K/uL)   Monocytes Absolute 0.3  0.1 - 1.0 (K/uL)   Eosinophils Absolute 0.1  0.0 - 0.7 (K/uL)   Basophils Absolute 0.0  0.0 - 0.1 (K/uL)  HEPATIC FUNCTION PANEL      Component Value Range   Total Bilirubin 0.5  0.3 - 1.2 (mg/dL)   Bilirubin, Direct 0.0  0.0 - 0.3 (mg/dL)   Alkaline Phosphatase 80  39 - 117 (U/L)   AST 15  0 - 37 (U/L)   ALT 12  0 - 53 (U/L)   Total Protein 6.8  6.0 - 8.3 (g/dL)   Albumin 3.8  3.5 - 5.2 (g/dL)  HEMOGLOBIN A2Z      Component Value Range   Hemoglobin A1C 6.8 (*) 4.6 - 6.5 (%)  MICROALBUMIN / CREATININE URINE RATIO      Component Value Range   Microalb, Ur 7.9 (*) 0.0 - 1.9 (mg/dL)   Creatinine,U 308.6     Microalb Creat Ratio 5.8  0.0 - 30.0 (mg/g)  LDL CHOLESTEROL, DIRECT      Component Value Range   Direct LDL 40.8    TSH      Component Value Range   TSH 0.79  0.35 - 5.50 (uIU/mL)

## 2012-04-09 ENCOUNTER — Other Ambulatory Visit: Payer: Self-pay | Admitting: *Deleted

## 2012-04-09 MED ORDER — PIOGLITAZONE HCL 45 MG PO TABS: 45.0000 mg | ORAL_TABLET | Freq: Every day | ORAL | Status: AC

## 2012-04-10 ENCOUNTER — Ambulatory Visit (INDEPENDENT_AMBULATORY_CARE_PROVIDER_SITE_OTHER): Payer: PRIVATE HEALTH INSURANCE | Admitting: Cardiovascular Disease

## 2012-04-10 ENCOUNTER — Encounter: Payer: Self-pay | Admitting: Cardiovascular Disease

## 2012-04-10 VITALS — BP 147/77 | HR 84 | Ht 68.0 in | Wt 198.0 lb

## 2012-04-10 DIAGNOSIS — I509 Heart failure, unspecified: Secondary | ICD-10-CM

## 2012-04-10 DIAGNOSIS — E119 Type 2 diabetes mellitus without complications: Secondary | ICD-10-CM

## 2012-04-10 DIAGNOSIS — I10 Essential (primary) hypertension: Secondary | ICD-10-CM

## 2012-04-10 DIAGNOSIS — E785 Hyperlipidemia, unspecified: Secondary | ICD-10-CM

## 2012-04-10 DIAGNOSIS — R609 Edema, unspecified: Secondary | ICD-10-CM

## 2012-04-10 DIAGNOSIS — I5022 Chronic systolic (congestive) heart failure: Secondary | ICD-10-CM

## 2012-04-10 DIAGNOSIS — I2510 Atherosclerotic heart disease of native coronary artery without angina pectoris: Secondary | ICD-10-CM

## 2012-04-10 MED ORDER — TORSEMIDE 20 MG PO TABS: 40.0000 mg | ORAL_TABLET | Freq: Two times a day (BID) | ORAL | Status: AC

## 2012-04-10 MED ORDER — TORSEMIDE 20 MG PO TABS: 20.0000 mg | ORAL_TABLET | Freq: Two times a day (BID) | ORAL | Status: DC

## 2012-04-10 NOTE — Progress Notes (Signed)
Patient ID: [name] Newman, male    DOB: Oct 01, 1941, 71 y.o.   MRN: [id number]  HPI Comments: 71 year old gentleman with a PMH of coronary artery disease, bypass surgery in January 03 with a LIMA to the LAD, sequential saphenous vein graft to the second and third obtuse marginals, saphenous vein graft to the first diagonal, sequential saphenous vein graft to the PDA and acute marginal with negative stress test in June 2010 with ejection fraction 39%, inferior, septal and inferolateral hypokinesis and scar with no ischemia, with history of falls and lower extremity weakness, history of syncope, remote smoking history who stopped in 2003, underlying diabetes who presents for routine followup.   [name] Newman  continues to have difficulty walking with chronic right lower extremity weakness. no chest pain. Family that presents with him today report he drinks significant amount of sodas. He has been trying to cut back though continues to drink quite a lot. He has some worsening lower extremity edema. This has been there for a long time, worse recently. Lasix was recently increased, 40 in the morning, 20 in the afternoon.    EKG shows normal sinus rhythm with rate 84 beats per minute with nonspecific ST changes in leads V5, V6, inferior leads, consider  anteroseptal infarct        Outpatient Encounter Prescriptions as of 04/10/2012  Medication Sig Dispense Refill  . albuterol (PROVENTIL HFA;VENTOLIN HFA) 108 (90 BASE) MCG/ACT inhaler Inhale 2 puffs into the lungs as needed.      . carvedilol (COREG) 6.25 MG tablet TAKE ONE TABLET BY MOUTH TWICE A DAY  60 tablet  6  . clopidogrel (PLAVIX) 75 MG tablet Take 1 tablet (75 mg total) by mouth daily.  30 tablet  11  . CRESTOR 20 MG tablet TAKE 1 TABLET BY MOUTH AT BEDTIME  30 tablet  6  . fluticasone (FLONASE) 50 MCG/ACT nasal spray Place 2 sprays into the nose daily.  16 g  12  . glipiZIDE (GLUCOTROL XL) 5 MG 24 hr tablet TAKE 1 TABLET BY MOUTH ONCE A DAY   30 tablet  6  . lidocaine (LIDODERM) 5 % Place 1 patch onto the skin as needed. Remove & Discard patch within 12 hours or as directed by MD      . metFORMIN (GLUCOPHAGE) 1000 MG tablet TAKE 1 TABLET BY MOUTH TWICE DAILY  60 tablet  4  . omeprazole (PRILOSEC) 40 MG capsule TAKE 1 CAPSULE BY MOUTH ONCE A DAY  30 capsule  11  . oxybutynin (DITROPAN-XL) 10 MG 24 hr tablet TAKE 1 TABLET BY MOUTH ONCE A DAY  30 tablet  11  . pioglitazone (ACTOS) 45 MG tablet Take 1 tablet (45 mg total) by mouth daily.  30 tablet  11  . saxagliptin HCl (ONGLYZA) 5 MG TABS tablet Take 1 tablet (5 mg total) by mouth daily.  30 tablet  6  . SPIRIVA HANDIHALER 18 MCG inhalation capsule INHALE CONTENTS OF ONE CAPSULE DAILY  30 each  4  .  furosemide (LASIX) 40 MG tablet Take 1 tablet in am and 1/2 tablet after lunch        Review of Systems  HENT: Negative.   Eyes: Negative.   Respiratory: Negative.   Cardiovascular: Positive for leg swelling.  Gastrointestinal: Negative.   Musculoskeletal: Negative.   Skin: Negative.   Neurological: Negative.   Hematological: Negative.   Psychiatric/Behavioral: Negative.   All other systems reviewed and are negative.    BP  147/77  Pulse 84  Ht 5\' 8"  (1.727 m)  Wt 198 lb (89.812 kg)  BMI 30.11 kg/m2  Physical Exam  Nursing note and vitals reviewed. Constitutional: He is oriented to person, place, and time. He appears well-developed and well-nourished.  HENT:  Head: Normocephalic.  Nose: Nose normal.  Mouth/Throat: Oropharynx is clear and moist.  Eyes: Conjunctivae are normal. Pupils are equal, round, and reactive to light.  Neck: Normal range of motion. Neck supple. No JVD present.  Cardiovascular: Normal rate, regular rhythm, S1 normal, S2 normal, intact distal pulses and normal pulses.  Exam reveals no gallop and no friction rub.   Murmur heard.  Crescendo systolic murmur is present with a grade of 2/6  Pulses:      Carotid pulses are 2+ on the right side, and 2+ on  the left side with bruit.      Radial pulses are 2+ on the right side, and 2+ on the left side.       Posterior tibial pulses are 2+ on the right side, and 2+ on the left side.  Pulmonary/Chest: Effort normal and breath sounds normal. No respiratory distress. He has no wheezes. He has no rales. He exhibits no tenderness.  Abdominal: Soft. Bowel sounds are normal. He exhibits no distension. There is no tenderness.  Musculoskeletal: Normal range of motion. He exhibits edema. He exhibits no tenderness.  Lymphadenopathy:    He has no cervical adenopathy.  Neurological: He is alert and oriented to person, place, and time. Coordination normal.  Skin: Skin is warm and dry. No rash noted. No erythema.  Psychiatric: He has a normal mood and affect. His behavior is normal. Judgment and thought content normal.           Assessment and Plan

## 2012-04-10 NOTE — Assessment & Plan Note (Signed)
Currently with no symptoms of angina. No further workup at this time. Continue current medication regimen. 

## 2012-04-10 NOTE — Assessment & Plan Note (Signed)
We will repeat echocardiogram to reevaluate his ejection fraction and right ventricular systolic pressures. We have suggested he change Lasix to torsemide 40 mg daily. If edema does not start to improve, he can add torsemide 40 mg after lunch when necessary.

## 2012-04-10 NOTE — Patient Instructions (Addendum)
You are doing well. Hold the lasix Start torsemide two in the am  If your edema does not improve, take 2 in the Am and two after lunch  Please call us if you have new issues that need to be addressed before your next appt.  Your physician wants you to follow-up in: 1 months.  You will receive a reminder letter in the mail two months in advance. If you don't receive a letter, please call our office to schedule the follow-up appointment.

## 2012-04-10 NOTE — Assessment & Plan Note (Signed)
Cholesterol is at goal on the current lipid regimen. No changes to the medications were made.  

## 2012-04-10 NOTE — Assessment & Plan Note (Signed)
Blood pressure is well controlled on today's visit. No changes made to the medications. 

## 2012-04-10 NOTE — Assessment & Plan Note (Signed)
He continues to drink a significant amount of soda. We have encouraged continued careful diet management in an effort to lose weight.

## 2012-04-19 ENCOUNTER — Other Ambulatory Visit: Payer: PRIVATE HEALTH INSURANCE

## 2012-04-19 ENCOUNTER — Other Ambulatory Visit (INDEPENDENT_AMBULATORY_CARE_PROVIDER_SITE_OTHER): Payer: PRIVATE HEALTH INSURANCE

## 2012-04-19 ENCOUNTER — Other Ambulatory Visit: Payer: Self-pay

## 2012-04-19 DIAGNOSIS — I2510 Atherosclerotic heart disease of native coronary artery without angina pectoris: Secondary | ICD-10-CM

## 2012-04-19 DIAGNOSIS — R079 Chest pain, unspecified: Secondary | ICD-10-CM

## 2012-04-19 DIAGNOSIS — R609 Edema, unspecified: Secondary | ICD-10-CM

## 2012-04-24 ENCOUNTER — Telehealth: Payer: Self-pay

## 2012-04-24 NOTE — Telephone Encounter (Signed)
Jessica faxed DME  form from [facility] for order to repair Hoveround chair. Verified with pt charge cord is not working. In Dr Copland's in box.

## 2012-04-25 NOTE — Telephone Encounter (Signed)
done

## 2012-05-03 ENCOUNTER — Other Ambulatory Visit: Payer: Self-pay | Admitting: Family Medicine

## 2012-05-11 ENCOUNTER — Ambulatory Visit: Payer: PRIVATE HEALTH INSURANCE | Admitting: Cardiovascular Disease

## 2012-05-15 ENCOUNTER — Other Ambulatory Visit: Payer: Self-pay

## 2012-05-15 NOTE — Telephone Encounter (Signed)
Let pt know we will need to await Dr. Patsy Lager return as not on med list.

## 2012-05-15 NOTE — Telephone Encounter (Signed)
Advised pt's daughter [name].   She says pt has been gettting this refilled by Dr. Patsy Lager but he has been out of it for about a week.

## 2012-05-15 NOTE — Telephone Encounter (Signed)
[name] Newman request refill Gabapentin 300 mg takes one tablet twice a day sent to [facility]. Med is not on pts current or history med list.Please advise.

## 2012-05-22 ENCOUNTER — Telehealth: Payer: Self-pay

## 2012-05-22 NOTE — Telephone Encounter (Signed)
Tammy case manager with [facility] [employer] left v/m needs service order for 60 underpads per month and 90 pull ups per month due to increase in fluid pills.Please advise.

## 2012-05-22 NOTE — Telephone Encounter (Signed)
Please refill and place on med list - he takes it.   Gabapentin 300 mg, 1 po bid. #60, 11 refills

## 2012-05-22 NOTE — Telephone Encounter (Signed)
Can you find out more, do they need a verbal order or are they going to send an order form?

## 2012-05-23 MED ORDER — GABAPENTIN 300 MG PO CAPS: 300.0000 mg | ORAL_CAPSULE | Freq: Two times a day (BID) | ORAL | Status: AC

## 2012-05-23 NOTE — Telephone Encounter (Signed)
Done

## 2012-05-23 NOTE — Telephone Encounter (Signed)
Tried to call case manager and got voice mail will call back later

## 2012-05-23 NOTE — Telephone Encounter (Signed)
As long as a verbal order is ok, can you please give one for below with #11 refills.

## 2012-05-24 NOTE — Telephone Encounter (Signed)
Verbal order given and asked case worker to call back if she needed a written order she is out of town until July 29th 2013

## 2012-05-28 ENCOUNTER — Telehealth: Payer: Self-pay | Admitting: Family Medicine

## 2012-05-28 ENCOUNTER — Other Ambulatory Visit: Payer: Self-pay | Admitting: Family Medicine

## 2012-05-28 MED ORDER — NONFORMULARY OR COMPOUNDED ITEM: Status: AC

## 2012-05-28 NOTE — Telephone Encounter (Signed)
Caller: Tammi/Other; Phone Number: [phone]; Message from caller: Phil Dopp, Social Worker from [facility] calling and states that she needs a written order for 90 pulls-ups per month size large and 60 underpads per month; please fax written order to [phone] Attn: Phil Dopp

## 2012-05-28 NOTE — Telephone Encounter (Signed)
Please do

## 2012-05-29 NOTE — Telephone Encounter (Signed)
Done

## 2012-05-29 NOTE — Telephone Encounter (Signed)
I need you to write and sign this please

## 2012-06-21 ENCOUNTER — Ambulatory Visit: Payer: PRIVATE HEALTH INSURANCE | Admitting: Family Medicine

## 2012-07-03 ENCOUNTER — Other Ambulatory Visit: Payer: Self-pay | Admitting: Family Medicine

## 2012-07-04 ENCOUNTER — Ambulatory Visit (INDEPENDENT_AMBULATORY_CARE_PROVIDER_SITE_OTHER): Payer: PRIVATE HEALTH INSURANCE | Admitting: Family Medicine

## 2012-07-04 ENCOUNTER — Encounter: Payer: Self-pay | Admitting: Family Medicine

## 2012-07-04 VITALS — BP 130/78 | HR 76 | Temp 97.7°F | Resp 16

## 2012-07-04 DIAGNOSIS — G20 Parkinson's disease: Secondary | ICD-10-CM

## 2012-07-04 DIAGNOSIS — I2510 Atherosclerotic heart disease of native coronary artery without angina pectoris: Secondary | ICD-10-CM

## 2012-07-04 DIAGNOSIS — R32 Unspecified urinary incontinence: Secondary | ICD-10-CM

## 2012-07-04 DIAGNOSIS — Z591 Inadequate housing, unspecified: Secondary | ICD-10-CM

## 2012-07-04 DIAGNOSIS — E785 Hyperlipidemia, unspecified: Secondary | ICD-10-CM

## 2012-07-04 DIAGNOSIS — G20A1 Parkinson's disease without dyskinesia, without mention of fluctuations: Secondary | ICD-10-CM

## 2012-07-04 DIAGNOSIS — E1140 Type 2 diabetes mellitus with diabetic neuropathy, unspecified: Secondary | ICD-10-CM

## 2012-07-04 DIAGNOSIS — I10 Essential (primary) hypertension: Secondary | ICD-10-CM

## 2012-07-04 DIAGNOSIS — Z79899 Other long term (current) drug therapy: Secondary | ICD-10-CM

## 2012-07-04 DIAGNOSIS — E1149 Type 2 diabetes mellitus with other diabetic neurological complication: Secondary | ICD-10-CM

## 2012-07-04 DIAGNOSIS — E119 Type 2 diabetes mellitus without complications: Secondary | ICD-10-CM

## 2012-07-04 DIAGNOSIS — I509 Heart failure, unspecified: Secondary | ICD-10-CM

## 2012-07-04 DIAGNOSIS — E1142 Type 2 diabetes mellitus with diabetic polyneuropathy: Secondary | ICD-10-CM

## 2012-07-04 NOTE — Patient Instructions (Addendum)
REFERRAL: GO THE THE FRONT ROOM AT THE ENTRANCE OF OUR CLINIC, NEAR CHECK IN. ASK FOR MARION. SHE WILL HELP YOU SET UP YOUR REFERRAL. DATE: TIME:  
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
## 2012-07-05 LAB — BASIC METABOLIC PANEL
CO2: 27 mEq/L (ref 19–32)
Calcium: 9.3 mg/dL (ref 8.4–10.5)
Creatinine, Ser: 0.9 mg/dL (ref 0.4–1.5)
Glucose, Bld: 58 mg/dL — ABNORMAL LOW (ref 70–99)

## 2012-07-05 LAB — HEPATIC FUNCTION PANEL
Albumin: 3.6 g/dL (ref 3.5–5.2)
Bilirubin, Direct: 0.1 mg/dL (ref 0.0–0.3)
Total Protein: 6.7 g/dL (ref 6.0–8.3)

## 2012-07-09 ENCOUNTER — Encounter: Payer: Self-pay | Admitting: *Deleted

## 2012-07-10 ENCOUNTER — Telehealth: Payer: Self-pay

## 2012-07-10 ENCOUNTER — Observation Stay: Payer: Self-pay | Admitting: Internal Medicine

## 2012-07-10 LAB — COMPREHENSIVE METABOLIC PANEL
Albumin: 4.1 g/dL (ref 3.4–5.0)
Alkaline Phosphatase: 131 U/L (ref 50–136)
Anion Gap: 7 (ref 7–16)
BUN: 16 mg/dL (ref 7–18)
Calcium, Total: 9.6 mg/dL (ref 8.5–10.1)
Co2: 32 mmol/L (ref 21–32)
Creatinine: 0.9 mg/dL (ref 0.60–1.30)
EGFR (Non-African Amer.): >60
SGOT(AST): 32 U/L (ref 15–37)
Sodium: 140 mmol/L (ref 136–145)

## 2012-07-10 LAB — URINALYSIS, COMPLETE
Bacteria: NONE SEEN
Bilirubin,UR: NEGATIVE
Ketone: NEGATIVE
Leukocyte Esterase: NEGATIVE
Nitrite: NEGATIVE
RBC,UR: 4 /HPF (ref 0–5)
Squamous Epithelial: <1

## 2012-07-10 LAB — DRUG SCREEN, URINE
Benzodiazepine, Ur Scrn: NEGATIVE (ref ?–200)
Cannabinoid 50 Ng, Ur ~~LOC~~: NEGATIVE (ref ?–50)
Cocaine Metabolite,Ur ~~LOC~~: NEGATIVE (ref ?–300)
Methadone, Ur Screen: NEGATIVE (ref ?–300)
Phencyclidine (PCP) Ur S: NEGATIVE (ref ?–25)
Tricyclic, Ur Screen: NEGATIVE (ref ?–1000)

## 2012-07-10 LAB — TROPONIN I: Troponin-I: <0.02 ng/mL

## 2012-07-10 LAB — CBC
HGB: 14.6 g/dL (ref 13.0–18.0)
MCH: 30.8 pg (ref 26.0–34.0)
MCHC: 33.4 g/dL (ref 32.0–36.0)

## 2012-07-10 LAB — DIFFERENTIAL
Eosinophil #: 0.1 10*3/uL (ref 0.0–0.7)
Eosinophil %: 1.6 %
Lymphocyte #: 1.7 10*3/uL (ref 1.0–3.6)
Monocyte #: 0.5 x10 3/mm (ref 0.2–1.0)
Neutrophil %: 62.7 %

## 2012-07-10 NOTE — Telephone Encounter (Signed)
Maryruth Hancock nurse with [facility] wanted Dr Patsy Lager to know 11:30 am this morning pt was not alert,lethagic, not eating, feet very swollen, not orientated to time, place, or person.  BS was 124.Ann called 911 and pt transported to [facility] ER for eval.

## 2012-07-10 NOTE — Telephone Encounter (Signed)
Agreed -

## 2012-07-11 DIAGNOSIS — R011 Cardiac murmur, unspecified: Secondary | ICD-10-CM

## 2012-07-11 LAB — COMPREHENSIVE METABOLIC PANEL
Albumin: 3.6 g/dL (ref 3.4–5.0)
BUN: 14 mg/dL (ref 7–18)
Bilirubin,Total: 0.9 mg/dL (ref 0.2–1.0)
Calcium, Total: 9.3 mg/dL (ref 8.5–10.1)
Chloride: 103 mmol/L (ref 98–107)
Creatinine: 0.92 mg/dL (ref 0.60–1.30)
EGFR (Non-African Amer.): >60
Glucose: 97 mg/dL (ref 65–99)
Osmolality: 289 (ref 275–301)
SGOT(AST): 23 U/L (ref 15–37)
SGPT (ALT): 15 U/L (ref 12–78)
Sodium: 145 mmol/L (ref 136–145)
Total Protein: 7.2 g/dL (ref 6.4–8.2)

## 2012-07-11 LAB — CBC WITH DIFFERENTIAL/PLATELET
Basophil #: 0 10*3/uL (ref 0.0–0.1)
Eosinophil #: 0.1 10*3/uL (ref 0.0–0.7)
Lymphocyte %: 28.1 %
MCHC: 34.1 g/dL (ref 32.0–36.0)
Neutrophil #: 3.7 10*3/uL (ref 1.4–6.5)
Neutrophil %: 62.4 %
RDW: 15.1 % — ABNORMAL HIGH (ref 11.5–14.5)

## 2012-07-11 LAB — CK TOTAL AND CKMB (NOT AT ARMC): CK-MB: 1.3 ng/mL (ref 0.5–3.6)

## 2012-07-11 NOTE — Telephone Encounter (Signed)
Very appropriate

## 2012-07-12 LAB — POTASSIUM: Potassium: 3.5 mmol/L (ref 3.5–5.1)

## 2012-07-13 ENCOUNTER — Emergency Department: Payer: Self-pay | Admitting: Emergency Medicine

## 2012-07-13 LAB — URINALYSIS, COMPLETE
Bacteria: NONE SEEN
Glucose,UR: NEGATIVE mg/dL (ref 0–75)
Nitrite: POSITIVE
Protein: 100
RBC,UR: 17 /HPF (ref 0–5)
Specific Gravity: 1.019 (ref 1.003–1.030)
Squamous Epithelial: 2

## 2012-07-13 LAB — COMPREHENSIVE METABOLIC PANEL
Alkaline Phosphatase: 106 U/L (ref 50–136)
Anion Gap: 8 (ref 7–16)
BUN: 13 mg/dL (ref 7–18)
Bilirubin,Total: 0.8 mg/dL (ref 0.2–1.0)
Calcium, Total: 9.2 mg/dL (ref 8.5–10.1)
Chloride: 107 mmol/L (ref 98–107)
Co2: 28 mmol/L (ref 21–32)
Creatinine: 1.09 mg/dL (ref 0.60–1.30)
EGFR (African American): >60
EGFR (Non-African Amer.): >60
Osmolality: 288 (ref 275–301)
Potassium: 3.4 mmol/L — ABNORMAL LOW (ref 3.5–5.1)
Sodium: 143 mmol/L (ref 136–145)

## 2012-07-13 LAB — ACETAMINOPHEN LEVEL: Acetaminophen: 16 ug/mL

## 2012-07-13 LAB — CBC WITH DIFFERENTIAL/PLATELET
Basophil #: 0 10*3/uL (ref 0.0–0.1)
Basophil %: 0.4 %
Eosinophil #: 0.1 10*3/uL (ref 0.0–0.7)
HCT: 39.2 % — ABNORMAL LOW (ref 40.0–52.0)
Lymphocyte #: 1.6 10*3/uL (ref 1.0–3.6)
Lymphocyte %: 26.7 %
MCH: 31.5 pg (ref 26.0–34.0)
MCHC: 34.2 g/dL (ref 32.0–36.0)
Monocyte #: 0.4 x10 3/mm (ref 0.2–1.0)
Monocyte %: 7.2 %
Neutrophil #: 3.8 10*3/uL (ref 1.4–6.5)
RDW: 15 % — ABNORMAL HIGH (ref 11.5–14.5)
WBC: 5.9 10*3/uL (ref 3.8–10.6)

## 2012-07-13 LAB — SALICYLATE LEVEL: Salicylates, Serum: <1.7 mg/dL

## 2012-07-13 LAB — DRUG SCREEN, URINE
Amphetamines, Ur Screen: NEGATIVE (ref ?–1000)
Barbiturates, Ur Screen: NEGATIVE (ref ?–200)
Cannabinoid 50 Ng, Ur ~~LOC~~: NEGATIVE (ref ?–50)
Cocaine Metabolite,Ur ~~LOC~~: NEGATIVE (ref ?–300)
Methadone, Ur Screen: NEGATIVE (ref ?–300)
Opiate, Ur Screen: NEGATIVE (ref ?–300)

## 2012-07-13 LAB — TSH: Thyroid Stimulating Horm: 1.24 u[IU]/mL

## 2012-07-23 NOTE — Telephone Encounter (Signed)
Noted, last week called [facility], no return call to me from his case worker.

## 2012-07-30 ENCOUNTER — Telehealth: Payer: Self-pay | Admitting: Family Medicine

## 2012-07-30 NOTE — Telephone Encounter (Signed)
The patient's daughter ([name]) called the triage line hoping to speak with the nurse about the patient's medication. Her callback number - [phone]   Thanks!

## 2012-07-30 NOTE — Telephone Encounter (Signed)
Left v/m for Tammy to call back
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
## 2012-08-01 NOTE — Telephone Encounter (Signed)
Spoke with Tammy on the phone. Her dad is coming to live with her on Friday.   Psychiatrist came by today, changed medication around some. Was very groggy on Haldol in the SnF.   Many med changes, will need to have f/u to review all and CAP program. Home PT helpful and home care.   Hannah Beat, MD 08/01/2012, 5:05 PM

## 2012-08-06 ENCOUNTER — Telehealth: Payer: Self-pay

## 2012-08-06 ENCOUNTER — Encounter: Payer: Self-pay | Admitting: Family Medicine

## 2012-08-06 DIAGNOSIS — F0390 Unspecified dementia without behavioral disturbance: Secondary | ICD-10-CM | POA: Insufficient documentation

## 2012-08-06 NOTE — Telephone Encounter (Signed)
Call  Long form, I am working on it.

## 2012-08-06 NOTE — Telephone Encounter (Signed)
[name] Newman left v/m requesting referral for Dr Copland's assessment to get CNA back; [name] said our office should already have form & completed  Form needs to be faxed to [phone].Please advise.

## 2012-08-07 NOTE — Telephone Encounter (Signed)
Tammy notified as instructed by Dr. Patsy Lager

## 2012-08-20 ENCOUNTER — Encounter: Payer: Self-pay | Admitting: Family Medicine

## 2012-08-20 ENCOUNTER — Encounter: Payer: PRIVATE HEALTH INSURANCE | Admitting: Family Medicine

## 2012-08-20 NOTE — Progress Notes (Signed)
  This encounter was created in error - please disregard.  An appointment was made  To see me, since in the interval time period the patient was discharged from the skilled nursing facility, then readmitted to the nursing facility.  He is currently being actively managed by Dr. Dorothey Baseman in the skilled nursing home. Error - thought to be d/c home.   Hannah Beat, MD 08/21/2012, 8:28 AM

## 2012-10-05 NOTE — Telephone Encounter (Signed)
patient

## 2012-10-31 DEATH — deceased

## 2012-12-11 ENCOUNTER — Telehealth: Payer: Self-pay | Admitting: Family Medicine

## 2012-12-11 NOTE — Telephone Encounter (Signed)
[name] called and wants to discuss her father's compentency. It's in reference to his last will and testament.  She asks that you please return her call. Thank you.

## 2012-12-12 ENCOUNTER — Encounter: Payer: Self-pay | Admitting: Family Medicine

## 2012-12-12 ENCOUNTER — Telehealth: Payer: Self-pay | Admitting: *Deleted

## 2012-12-12 NOTE — Telephone Encounter (Signed)
done

## 2012-12-12 NOTE — Telephone Encounter (Signed)
Done and ready 

## 2012-12-12 NOTE — Telephone Encounter (Signed)
Discussed with dr copland patient needs letter

## 2012-12-12 NOTE — Telephone Encounter (Signed)
Patients daughter advised letter ready to pick up

## 2013-05-09 ENCOUNTER — Other Ambulatory Visit: Payer: Self-pay

## 2014-11-14 ENCOUNTER — Encounter: Payer: Self-pay | Admitting: Cardiology

## 2015-02-17 NOTE — Consult Note (Signed)
Brief Consult Note: Diagnosis: Alzheimers dementia with behavioral disturbance, UTI delirium.   Patient was seen by consultant.   Consult note dictated.   Recommend further assessment or treatment.   Orders entered.   Discussed with Attending MD.   Comments: [name] Newman has a h/o dementia. He was just hospitalized at [facility] for COPD exacerbation. He returns to the ER from SNF for agiated and threatening behavior in the context of new UTI. He was started on antibiotics for UTI. Delirium has resolved.   MSE: Alert, oriented to person, place and somewhat situation. He is cool and collected at the moment. No suicidal or homicidal threats. He complains of back pain from "neck down". He is compliant with medications.   PLA N: 1. The patient no longer meets criteria for IVC. I will terminate proceedings. Please discharge as appropriate.    2. The patient is to continue all his medications..  Electronic Signatures: [name] Newman (MD)  (Signed 16-Sep-13 14:46)  Authored: Brief Consult Note   Last Updated: 16-Sep-13 14:46 by [name] Newman (MD)

## 2015-02-17 NOTE — Consult Note (Signed)
PATIENT NAME:  ARDITH, LEWMAN MR#:  [id number] DATE OF BIRTH:  May 27, 1941  DATE OF CONSULTATION:  07/13/2012  REFERRING PHYSICIAN:  Conni Slipper, MD CONSULTING PHYSICIAN:  Kinya Meine B. Tavyn Kurka, MD  REASON FOR CONSULTATION: To evaluate the patient with altered mental status.   IDENTIFYING DATA: Mr. Recore is a 74 year old male with history of dementia.   CHIEF COMPLAINT: Patient unable to state.   HISTORY OF PRESENT ILLNESS: Mr. Nhan was recently hospitalized briefly at [facility] for exacerbation of chronic obstructive pulmonary disease. He was discharged after 2 days. He was discharged to a skilled nursing facility. In less than 24 hours the patient returns to the emergency room with altered mental status. He was found to have urinary tract infection. He was given IV Cipro in the emergency room and has been continuing on medicines. He was brought to the emergency room for poor behavior. He was agitated, threatening staff, spitting at them, mostly in the course of urinary tract infection delirium. He was initially calm and cooperative in the emergency room but as day went by he became agitated, mad, and threatening. We initially hoped to return the patient to his skilled nursing facility but following a change in his behavior we decided to refer him to geropsychiatry unit. It is; however, unlikely that they will accept this patient as his problem is clearly medical and not psychiatric. The patient is really unable to participate in a psychiatric interview. He is unable to provide any information.   PAST PSYCHIATRIC HISTORY: Apparently there is no history of mental illness.   FAMILY PSYCHIATRIC HISTORY: Unknown.   PAST MEDICAL HISTORY:  1. Diabetes.  2. Chronic obstructive pulmonary disease. 3. Hypertension.  4. Hypokalemia. 5. Right foot drop.   ALLERGIES: Lipitor.   MEDICATIONS ON ADMISSION:  1. Plavix 75 mg daily.  2. Coreg 6.25 twice daily.  3. Crestor  20 mg daily.  4. Furosemide 40 mg daily.  5. Glipizide 5 mg daily.  6. Pantoprazole 40 mg daily.  7. Spiriva 18 mcg daily.  8. Fluticasone twice daily. 9. Albuterol every 4 hours as needed for shortness of breath. 10. Metformin 1000 mg twice daily.  11. Oxybutynin 5 mg daily.   SOCIAL HISTORY: He used to live independently with 24-hour nursing care at home.  Apparently now he has been discharged to skilled nursing facility. There are 2 daughters. I was unable to contact.   REVIEW OF SYSTEMS: Difficult to obtain. The patient denies being in any pain or discomfort.   PHYSICAL EXAMINATION:  VITAL SIGNS: Blood pressure 104/65, pulse 77, respirations 22, temperature 99.   GENERAL: This is a an elderly gentleman, yelling and screaming, agitated. The rest of the physical examination is deferred to his primary attending.   LABORATORY DATA: Chemistries within normal limits except for blood glucose of 155 and potassium 3.4. Blood alcohol level 0. LFTs within normal limits. TSH 1.24. Urine toxicology screen negative for substances. CBC within normal limits. Urinalysis: Leukocyte esterase 3+, white blood count over 300.   MENTAL STATUS EXAMINATION: The patient is alert. He is in bed in the emergency room, agitated, hollering, cursing. He wears a hospital gown. There is no eye contact. His speech is loud. Mood difficult to assess.  Affect agitated. His thought process is slow and illogical. It is difficult to assess for suicidal or homicidal ideation. He clearly has been threatening to staff members, peers, and the hospital workers now. There are apparently no delusions or paranoia. He does not  appear to attend to internal stimuli. His cognition is impaired. His insight and judgment are extremely poor.   SUICIDE RISK ASSESSMENT: This is a patient with a history of dementia who became agitated most likely in the course of urinary tract infection delirium. He is unable to plan or execute a suicide attempt.  He requires support and supervision.   DIAGNOSES:  AXIS I: Alzheimer's dementia with behavioral disturbance. Urinary tract infection delirium.  AXIS II: Deferred.  AXIS III: Diabetes. Chronic obstructive pulmonary disease. Hypertension. Hypokalemia. Right foot drop. Urinary tract infection.  AXIS IV: Physical and mental illness, cognitive decline, loss of way of life.  AXIS V: Global assessment of function 30.   PLAN: The patient is referred to geropsychiatry unit. He is placed on IVC. I will start low-dose Haldol for delirium. We will continue all other medications as prescribed by his primary provider. I will follow up if necessary.       ____________________________ Wardell Honour Bary Leriche, MD jbp:[id number] D: 07/13/2012 18:47:33 ET T: 07/14/2012 08:59:40 ET JOB#: [id number]  cc: Phil Corti B. Bary Leriche, MD, <Dictator> Clovis Fredrickson MD ELECTRONICALLY SIGNED 07/15/2012 5:49

## 2015-02-17 NOTE — Consult Note (Signed)
Brief Consult Note: Diagnosis: Alzheimers dementia with behavioral disturbance, UTI delirium.   Patient was seen by consultant.   Consult note dictated.   Recommend further assessment or treatment.   Orders entered.   Discussed with Attending MD.   Comments: [name] Newman has a h/o dementia. He was just hospitalized at [facility] for COPD exacerbation. He returns to the ER from SNF for agiated and threatening behavior. In the ER, he was initially cool and pleasant but now he is getting "mad", cursing, agitated.   PLA N: 1. The patient will be referred to [facility]. I will place him on IVC.  2. I will start low dose haldol for delirium.  3. We will continue all other medications as prescribed by his PCP.Marland Kitchen  Electronic Signatures: Orson Slick (MD)  (Signed 13-Sep-13 14:49)  Authored: Brief Consult Note   Last Updated: 13-Sep-13 14:49 by Orson Slick (MD)

## 2015-02-17 NOTE — Consult Note (Signed)
Brief Consult Note: Diagnosis: Alzheimers dementia with behavioral disturbance, UTI delirium.   Patient was seen by consultant.   Consult note dictated.   Recommend further assessment or treatment.   Orders entered.   Discussed with Attending MD.   Comments: [name] Newman has a h/o dementia. He was just hospitalized at [facility] for COPD exacerbation. He returns to the ER from SNF for agiated and threatening behavior. In the ER, he was getting "mad", cursing, agitated. He was started on antibiotics for UTI. Yesterday, there was a bed available at a geropsychiatry unit 4 hours away and the ER physician did not feel that it was safw to transport this patient so far away. He is now awaiting transfer to [facility] pn Monday if bed available or discharge to his facility if his poor behavior resolves with UTI treatment.   MSE: Alert, oriented to person, place and somewhat situation. He is cool and collected at the moment. No suicidal or homicidal threats. He complains of back pain from "neck down". He is compliant with medications.   PLA N: 1. Please treat UTI.   2. The patient is referred to [facility]. There are no beds available today.  3. He is getting low dose haldol for delirium.  4. We will continue all other medications as prescribed by his PCP.  5. I will follow up.  Electronic Signatures: Orson Slick (MD)  (Signed 15-Sep-13 11:50)  Authored: Brief Consult Note   Last Updated: 15-Sep-13 11:50 by Orson Slick (MD)

## 2015-02-17 NOTE — Discharge Summary (Signed)
PATIENT NAME:  [name] Newman, [name] Newman MR#:  [id number] DATE OF BIRTH:  04-14-1941  DATE OF ADMISSION:  07/10/2012 DATE OF DISCHARGE:  07/12/2012  DISCHARGE DIAGNOSES: 1. Metabolic encephalopathy due to hypercarbia. 2. Dementia.  3. Right footdrop. 4. Hypokalemia. 5. Hypertension. 6. Chronic obstructive pulmonary disease. 7. Diabetes mellitus. 8. Functional decline.  CONDITION ON DISCHARGE: Satisfactory.  HISTORY OF PRESENT ILLNESS: The patient is a 74 year old male with past medical history of significant coronary artery disease, diabetes, hypertension, hyperlipidemia, tobacco use, and dementia gradually getting worse over the last few months. He presented to the ER after he was noticed having a problem with his eating and not taking his medication. He also had altered mental status, not acting right, and he was more confused, as per family members. So he was brought to the ER. In the ER, he was found having increased CO2 level so we admitted him with the diagnosis of metabolic encephalopathy secondary to hypercarbia.   HOSPITAL COURSE:  1. After receiving treatment for his chronic obstructive pulmonary disease and hospital monitoring he had significant improvement in his mental status and the family agreed that he is back to his baseline status now. We also considered physical therapy and speech therapist for him because of the complaints and they recommended rehab and dysphagia diet, respectively. The other test we did for this is a CT of the had which was negative for any acute abnormality, but there was questionable presence of normal pressure hydrocephalus for which he will need to be followed by a neurologist on an out patient basis.  2. Chronic obstructive pulmonary disease. We continued his home medications as well as added DuoNeb nebulizers and oxygen during the hospital stay and he improved significantly.  3. Diabetes mellitus. He was managed with his regular medications and sliding scale  of subcutaneous insulin and his diabetes remained stable. 4. History of hypertension. We continued his blood pressure medication, Coreg. 5. Dementia. It might be baseline dementia, Alzheimer's or it might be secondary to normal pressure hydrocephalus. He needs to be followed by a neurologist for that. 6. He was also noticed having thrombocytopenia, but it remained stable during the hospital stay and no further work-up was done for that.  7. He was found having hypokalemia which was treated orally and it was resolved during the hospital stay.  DISCHARGE MEDICATIONS: 1. Plavix 75 mg once a day.  2. Coreg 6.25 mg two times a day. 3. Crestor 20 mg once a day. 4. Furosemide 40 mg once a day. 5. Glipizide 5 mg once a day.  6. Pantoprazole 40 mg once a day. 7. Spiriva 18 mcg one inhalation once a day. 8. Fluticasone 50 mcg inhalation two puffs once a day. 9. Lidoderm topically once a day. 10. Pro Air HFA inhaled every six hours as needed for shortness of breath. 11. Tylenol Arthritis 650 mg oral every eight hours as needed.  12. Metformin 1000 mg two times a day.  13. Oxybutynin 5 mg once a day.   CODE STATUS: FULL CODE.  DISCHARGE DIET: Low fat, low cholesterol, carbohydrate-controlled ADA diet. Consistency mechanical soft, thin liquids.   ACTIVITY LIMITATIONS: As tolerated.           DISCHARGE INSTRUCTIONS/FOLLOWUP: Return to work after visit with his MD. Timeframe for follow up is in 1 to 2 weeks and he also needs to be followed by a neurologist for possible normal pressure hydrocephalus.  ____________________________ Ceasar Lund Anselm Jungling, MD vgv:[id number] D: 07/12/2012 13:06:21 ET T: 07/12/2012 13:31:55  ET JOB#: [id number]  cc: Ceasar Lund. Anselm Jungling, MD, <Dictator> Vaughan Basta MD ELECTRONICALLY SIGNED 07/24/2012 15:26

## 2015-02-17 NOTE — H&P (Signed)
PATIENT NAME:  [name] Newman, [name] Newman MR#:  [id number] DATE OF BIRTH:  April 28, 1941  DATE OF ADMISSION:  07/10/2012  PRIMARY CARE PHYSICIAN: Dr. Edilia Bo [facility])  HISTORY OF PRESENT ILLNESS: The patient is a 74 year old Caucasian male with past medical history significant for history of coronary artery disease, history of diabetes, hypertension, hyperlipidemia, tobacco abuse, and history of dementia who presented to the hospital after he was found to be less alert today by home health nurse. According to the patient's ex-wife, he has been having problems with not eating as well as not taking his medications and not acting right, getting somewhat more confused over the past few days apparently, however, the extent of time is unclear. The patient has been cared by 24 hour care home health services as well as family members, the patient's sister. However, he was brought today because he was noted to have altered mental status earlier today by home health nurse.   PAST MEDICAL HISTORY:  1. Lumbar decompression surgery and fusion, in January 2007, by Dr. Mauri Pole.  2. Coronary artery disease status post myocardial infarction in January 2003. 3. Coronary artery bypass graft in 2003. 4. Diabetes mellitus with diabetic neuropathy. 5. Hypertension. 6. Hyperlipidemia. 7. Tobacco abuse. 8. Dementia.  MEDICATIONS:  1. Actos 45 mg p.o. daily.  2. Plavix 75 mg p.o. daily.  3. Coreg 6.25 mg p.o. twice daily. 4. Crestor 20 mg p.o. at bedtime. 5. Ditropan 5 mg p.o. daily. 6. Fluticasone propionate two sprays into nose daily. 7. Furosemide 40 mg p.o. daily.  8. Glipizide 5 mg p.o. daily.  9. Lidoderm patch 5% patch daily topically. 10. Metformin 1 gram p.o. twice daily. 11. Onglyza 5 mg p.o. daily.  12. Pantoprazole 40 mg p.o. daily.  13. Spiriva one inhalation daily. 14. Torsemide 40 mg p.o. twice daily. 15. Tylenol Arthritis 650 mg p.o. every eight hours as needed.   DRUG ALLERGIES: According to  medical records, the patient is allergic to Lipitor which gives him gastrointestinal distress.   FAMILY HISTORY: The patient's father had lung problems. The patient's mother had multiple medical problems including diabetes mellitus and coronary artery disease. Also family history significant for history of cerebrovascular accident as well as hypertension. The patient's sister had cerebrovascular accidents.   SOCIAL HISTORY: The patient has had seven wives. He has four children. He chews tobacco as well as used to smoke tobacco; he does not smoke anymore. He used to work as a Development worker, community.  REVIEW OF SYSTEMS: Difficult to obtain as the patient is poorly awake. He admits of having some cough which seems to be chronic, but no significant sputum production. He denies any fevers. He denies any pain in the chest. He denies any shortness of breath. He denies any abdominal pain or discomfort. Otherwise, I am not able to get much more from him.  PHYSICAL EXAMINATION:  VITALS: On arrival to the hospital, it was unclear how low his temperature was, however, it is reported as "9.4", pulse was in the 80s, respiration rate was 18, blood pressure was 102/61, and saturation was 93% on room air.   GENERAL: This is a well-nourished, Caucasian male who is somewhat sleepy, however easily woke up with some rub or shake or loud voice. He is able to open his eyes. However, he drifts back to sleep.  HEENT: Pupils are equally round and reactive to light. Extraocular movements are intact. No icterus or conjunctivitis. He has somewhat difficulty hearing. No pharyngeal erythema. Mucosa is moist.   NECK:  No masses, supple and nontender. Thyroid was not enlarged. No adenopathy. No JVD. The patient does have bilateral carotid bruits. Full range of motion.   LUNGS: Markedly diminished breath sounds bilaterally. No significant rales, rhonchi, or wheezing was noted. The patient does not have labored respirations or increased effort. No  dullness to percussion. Not in overt respiratory distress.   CARDIOVASCULAR: S1 and S2 appreciated. 4/6 systolic murmur was heard radiating into his neck as well as into his left axilla. PMI is not lateralized. Chest is nontender to palpation.   EXTREMITIES: 1+ pedal pulses. 1 to 2+ lower extremity edema around his pedal areas as well as lower part of distal shins. No calf tenderness or cyanosis was noted.   ABDOMEN: Soft and nontender. Bowel sounds are present. Mild discomfort was noted during suprapubic palpation. No hepatosplenomegaly or masses were noted.   RECTAL: Deferred.   MUSCULOSKELETAL: Able to move all extremities. No cyanosis or degenerative joint disease. Mild kyphosis. Gait is not tested.   SKIN: No rashes, lesions, erythema, nodularity, or induration. It was warm and dry to palpation.   LYMPH: No adenopathy in the cervical region.   NEUROLOGIC: Cranial nerves grossly intact. Sensory difficult to obtain. The patient is slightly dysarthric. No significant aphasia was noted. The patient is very somnolent, briefly opened his eyes, few words of conversation. No confusion or agitation was noted.   LABORATORY, DIAGNOSTIC AND RADIOLOGIC DATA: EKG showed normal sinus rhythm at 73 beats per minute, normal axis, and P depressions in inferolateral leads were noted. No EKG to compare with.  Glucose 133, otherwise BMP is unremarkable. Total protein is 8.6, otherwise unremarkable liver enzymes troponin. Level was normal at less than 0.02. White blood cell count is normal at 6.3, hemoglobin 14.6, and platelet count 129.   Urinalysis: Straw clear urine. Negative for glucose, bilirubin or ketones. Specific gravity 1.005. pH 7.0, 2+ blood, negative for protein, nitrites, or leukocyte esterase, 4 red blood cells, and 2 white blood cells. No bacteria were noted.   ABGs were done on 28% FiO2 and pH was 7.45, pCO2 50, pO2 101, and saturation 98.1%. Lactic acid level was 0.7.   Chest x-ray,  portable single view, on 07/10/2012, showed no evidence of acute cardiopulmonary abnormality.   CT of the head without contrast, on 07/10/2012, showed no acute intracranial process. There is ventriculomegaly which is somewhat out of proportion to the degree of cortical atrophy. This appearance can be seen with normal pressure hydrocephalus, according to the radiologist, was noted.   ASSESSMENT AND PLAN:  1. Altered mental status, questionable metabolic encephalopathy due to hypercarbia. Get urine drug screen. ABGs were observed. The patient is retaining CO2; however, his pH is normal at this time. We will continue neuro checks and we will admit for observation.  We will also ask PT as well as speech therapist to evaluate him for dysphagia diet for now.  2. Chronic obstructive pulmonary disease, questionable exacerbation. We will continue DuoNebs at this time as well as oxygen as needed to keep his oximetry at around 88% to 92%.  3. Heart murmur with minimal changes on EKG. We will continue Coreg as well as heparin subcutaneously. We will get cardiac enzymes x3. We will get also echocardiogram.  4. Lower extremity swelling. We will resume Lasix at lower doses and we will follow oral intake.  5. Diabetes mellitus. We will hold the patient's p.o. diabetic medications. We will get a Hemoglobin A1c. We will also follow the patient's p.o. intake and  will continue sliding scale insulin for now. 6. History of hypertension. We will continue Coreg. The patient's blood pressure seems to be quite well.  7. Dementia. CT is concerning for normal pressure hydrocephalus. We will get physical therapist evaluation as well.           8. Thrombocytopenia. We will follow tomorrow.  TIME SPENT: One hour. ____________________________ Theodoro Grist, MD rv:slb D: 07/10/2012 15:50:23 ET T: 07/10/2012 16:33:48 ET JOB#: [id number]  cc: Theodoro Grist, MD, <Dictator> Dr. Edilia Bo [facility]) Remo Kirschenmann Ether Griffins  MD ELECTRONICALLY SIGNED 07/11/2012 22:12
